# Patient Record
Sex: Male | Born: 1985 | Race: White | Hispanic: No | Marital: Married | Smoking: Never smoker
Health system: Southern US, Community
[De-identification: ages and names within clinical notes are randomized; demographics above are authoritative.]

## PROBLEM LIST (undated history)

## (undated) DIAGNOSIS — I739 Peripheral vascular disease, unspecified: Secondary | ICD-10-CM

## (undated) HISTORY — DX: Peripheral vascular disease, unspecified: I73.9

## (undated) HISTORY — PX: WISDOM TOOTH EXTRACTION: SHX21

---
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
## 2010-08-24 ENCOUNTER — Ambulatory Visit: Payer: Self-pay | Admitting: Sports Medicine

## 2011-03-19 ENCOUNTER — Ambulatory Visit: Payer: Self-pay | Admitting: Cardiovascular Disease

## 2014-04-20 ENCOUNTER — Encounter (INDEPENDENT_AMBULATORY_CARE_PROVIDER_SITE_OTHER): Payer: Self-pay

## 2014-04-20 ENCOUNTER — Ambulatory Visit (INDEPENDENT_AMBULATORY_CARE_PROVIDER_SITE_OTHER): Payer: 59 | Admitting: Family Medicine

## 2014-04-20 ENCOUNTER — Encounter: Payer: Self-pay | Admitting: Family Medicine

## 2014-04-20 VITALS — BP 149/97 | HR 54 | Ht 76.0 in | Wt 200.0 lb

## 2014-04-20 DIAGNOSIS — S99919A Unspecified injury of unspecified ankle, initial encounter: Secondary | ICD-10-CM

## 2014-04-20 DIAGNOSIS — S8992XA Unspecified injury of left lower leg, initial encounter: Secondary | ICD-10-CM

## 2014-04-20 DIAGNOSIS — S8990XA Unspecified injury of unspecified lower leg, initial encounter: Secondary | ICD-10-CM

## 2014-04-20 DIAGNOSIS — S99929A Unspecified injury of unspecified foot, initial encounter: Secondary | ICD-10-CM

## 2014-04-20 NOTE — Patient Instructions (Signed)
You have bruised your meniscus, worst case scenario have a small meniscus tear. Start with conservative treatment. Straight leg raises, knee extensions 3 sets of 10 once a day. Ibuprofen 600mg  three times a day OR aleve 2 tabs twice a day with food for pain and inflammation - typically use for a week then as needed. Icing after exercise for 15 minutes at a time. Elevate as needed for swelling. Ok for straight line running today;  Can try sprints tomorrow, tennis drills Thursday, then full tennis Friday if you continue to do well as I expect you will. Follow up with me in 4-6 weeks. If not improving would consider imaging.

## 2014-04-22 ENCOUNTER — Encounter: Payer: Self-pay | Admitting: Family Medicine

## 2014-04-22 DIAGNOSIS — S8992XA Unspecified injury of left lower leg, initial encounter: Secondary | ICD-10-CM | POA: Insufficient documentation

## 2014-04-22 NOTE — Progress Notes (Signed)
Patient ID: Shawn Hall, male   DOB: [birth year], 28 y.o.   MRN: [id number]  PCP: Enid BaasKarl Fields, MD  Subjective:   HPI: Patient is a 28 y.o. male here for left knee injury.  Patient reports he was dancing, trying to go lower than his body would allow. When doing so he felt and heard a pop medial aspect of left knee. No pain initially - worsened over next 24 hours. Associated swelling though this has improved. No catching, locking, giving out. No prior injuries.  History reviewed. No pertinent past medical history.  No current outpatient prescriptions on file prior to visit.   No current facility-administered medications on file prior to visit.    Past Surgical History  Procedure Laterality Date  . Wisdom tooth extraction      Allergies  Allergen Reactions  . Penicillins     History   Social History  . Marital Status: Single    Spouse Name: N/A    Number of Children: N/A  . Years of Education: N/A   Occupational History  . Not on file.   Social History Main Topics  . Smoking status: Never Smoker   . Smokeless tobacco: Not on file  . Alcohol Use: Not on file  . Drug Use: Not on file  . Sexual Activity: Not on file   Other Topics Concern  . Not on file   Social History Narrative  . No narrative on file    No family history on file.  BP 149/97  Pulse 54  Ht 6\' 4"  (1.93 m)  Wt 200 lb (90.719 kg)  BMI 24.35 kg/m2  Review of Systems: See HPI above.    Objective:  Physical Exam:  Gen: NAD  Left knee: No gross deformity, ecchymoses, swelling. No TTP currently. FROM. Negative ant/post drawers. Negative valgus/varus testing. Negative lachmanns. Negative mcmurrays, apleys, patellar apprehension. NV intact distally.    Assessment & Plan:  1. Left knee injury - Patient's description of pain with a pop are concerning for meniscal injury but he does not have an effusion and meniscus, other tests of knee are negative and reassuring.  Will start with  conservative treatment - home exercise program, nsaids, icing, elevating.  Discussed a return to sports, tennis program as tolerated.  F/u in 4-6 weeks.  Consider imaging if not improving.

## 2014-04-22 NOTE — Assessment & Plan Note (Signed)
Patient's description of pain with a pop are concerning for meniscal injury but he does not have an effusion and meniscus, other tests of knee are negative and reassuring.  Will start with conservative treatment - home exercise program, nsaids, icing, elevating.  Discussed a return to sports, tennis program as tolerated.  F/u in 4-6 weeks.  Consider imaging if not improving.

## 2014-12-01 ENCOUNTER — Ambulatory Visit (INDEPENDENT_AMBULATORY_CARE_PROVIDER_SITE_OTHER): Payer: 59 | Admitting: Sports Medicine

## 2014-12-01 ENCOUNTER — Encounter: Payer: Self-pay | Admitting: Sports Medicine

## 2014-12-01 VITALS — BP 143/98 | HR 66 | Ht 76.0 in | Wt 210.0 lb

## 2014-12-01 DIAGNOSIS — S86112A Strain of other muscle(s) and tendon(s) of posterior muscle group at lower leg level, left leg, initial encounter: Secondary | ICD-10-CM | POA: Diagnosis not present

## 2014-12-01 DIAGNOSIS — S8992XA Unspecified injury of left lower leg, initial encounter: Secondary | ICD-10-CM | POA: Diagnosis not present

## 2014-12-01 DIAGNOSIS — M79672 Pain in left foot: Secondary | ICD-10-CM | POA: Diagnosis not present

## 2014-12-01 NOTE — Assessment & Plan Note (Signed)
-  I will see him back for grafting or possible orthotics, as he needs additional arch support and padding.

## 2014-12-01 NOTE — Assessment & Plan Note (Signed)
Chronic change with hypoechoic collection in the muscle fibers with localized hyperechoic region consistent with scar tissue. -Body helix compression sleeve was fitted and applied to the left calf -He will begin he eccentric calf exercises with progression to eventual concentric exercises when his pain free. -Followup in 4-6 weeks for the left calf, but sooner for custom orthotics

## 2014-12-01 NOTE — Progress Notes (Signed)
   Subjective:    Patient ID: Shawn Hall, male    DOB: June 01, 1986, 29 y.o.   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  Onset was sometime last fall without any known acute injury.  He had been trying to get back into playing more tennis when his symptoms started. He notes a sharp throbbing pain located in the lower portion of his left calf that is aggravated with running, cutting, and pressing on this region.  He denies any swelling or bruising.  He also has left midfoot pain across the dorsal arch.  He denies any numbness or tingling.  He says that sometimes his calf will lock up and feel tight.  He has not tried any relieving factors other than rest.  Past medical history, social history, medications, and allergies were reviewed and are up to date in the chart. Review of Systems 7 point review of systems was performed and was otherwise negative unless noted in the history of present illness.     Objective:   Physical Exam BP 143/98 mmHg  Pulse 66  Ht 6\' 4"  (1.93 m)  Wt 210 lb (95.255 kg)  BMI 25.57 kg/m2 GEN: The patient is well-developed well-nourished male and in no acute distress.  He is awake alert and oriented x3. SKIN: warm and well-perfused, no rash  EXTR: No lower extremity edema Neuro: Strength 5/5 globally. Sensation intact throughout. No focal deficits. Vasc: +2 bilateral distal pulses. No edema.  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  It is a fairly well-preserved medial longitudinal arch.  Good posterior tibialis tendon strength.  He has good plantar and dorsiflexion strength.  No calf atrophy or swelling.  Localized area of tenderness to palpation at the inferior pole of the medial gastroc.  The Achilles tendon is intact and nontender.  Limited musculoskeletal ultrasound: Long and short axis is rotation of the left  calf which reveal a localized area of hypoechoic change in the medial gastroc with associated hyperechoic collection consistent with scar tissue.  No hematoma seen.  The Achilles tendon otherwise appears to have normal caliber and is intact.     Assessment & Plan:  Please see problem based assessment and plan in the problem list.

## 2014-12-07 ENCOUNTER — Encounter: Payer: 59 | Admitting: Sports Medicine

## 2014-12-14 ENCOUNTER — Encounter: Payer: 59 | Admitting: Sports Medicine

## 2014-12-21 ENCOUNTER — Ambulatory Visit (INDEPENDENT_AMBULATORY_CARE_PROVIDER_SITE_OTHER): Payer: 59 | Admitting: Sports Medicine

## 2014-12-21 VITALS — BP 126/80 | HR 69 | Ht 76.0 in | Wt 210.0 lb

## 2014-12-21 DIAGNOSIS — M79672 Pain in left foot: Secondary | ICD-10-CM

## 2014-12-21 NOTE — Assessment & Plan Note (Addendum)
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 Crafted custom orthotics today. Added 1st ray posting bilaterally. -follow-up if needed.  Greater than 50% of this 40 minute visit was spent face-to-face with the patient spent crafting, modifying, and testing the custom orthotics.

## 2014-12-21 NOTE — Progress Notes (Addendum)
   Subjective:    Patient ID: Shawn Hall, male    DOB: 11/02/1985, 29 y.o.   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 Onset of symptoms was sometime last fall without any known acute injury. He had been trying to get back into playing more tennis when his symptoms started. When he was last seen he was diagnosed with a partial tear of the medial head of his left gastroc. He has tried compression and some rehabilitation exercises and his pain is improving. He also has left midfoot pain across the dorsal arch. He has a pes cavus foot and is first ray dominant. He denies any numbness or tingling.  Past medical history, social history, medications, and allergies were reviewed and are up to date in the chart.  Review of Systems 7 point review of systems was performed and was otherwise negative unless noted in the history of present illness.     Objective:   Physical Exam There were no vitals taken for this visit. GEN: The patient is well-developed well-nourished male and in no acute distress.  He is awake alert and oriented x3. SKIN: warm and well-perfused, no rash  EXTR: No lower extremity edema or calf tenderness Neuro: Strength 5/5 globally. Sensation intact throughout. DTRs 2/4 bilaterally. No focal deficits. Vasc: +2 bilateral distal pulses. No edema.  MSK: Examination of the bilateral feet reveals a pes cavus. Full ankle range of motion without pain. Mild pain along the midportion of the medial longitudinal arch. No bony tenderness. He has first ray dominant some slight splaying between the first and second metatarsals. No localized bruising or swelling.  Patient was fitted for a : standard, cushioned, semi-rigid orthotic.  The orthotic was heated and afterward the patient stood on the orthotic blank positioned on the orthotic stand.  The patient was  positioned in subtalar neutral position and 10 degrees of ankle dorsiflexion in a weight bearing stance.  After completion of molding, a stable base was applied to the orthotic blank.  The blank was ground to a stable position for weight bearing.  Size: 13 Base: EVA Posting: bilateral 1st ray  Greater than 50% of this 40 minute visit was spent face-to-face with the patient spent crafting, modifying, and testing the custom orthotics.     Assessment & Plan:  Please see problem based assessment and plan in the problem list.

## 2015-01-12 ENCOUNTER — Ambulatory Visit: Payer: 59 | Admitting: Sports Medicine

## 2015-08-15 ENCOUNTER — Ambulatory Visit (INDEPENDENT_AMBULATORY_CARE_PROVIDER_SITE_OTHER): Payer: 59 | Admitting: Podiatry

## 2015-08-15 ENCOUNTER — Ambulatory Visit (INDEPENDENT_AMBULATORY_CARE_PROVIDER_SITE_OTHER): Payer: 59

## 2015-08-15 ENCOUNTER — Encounter: Payer: Self-pay | Admitting: Podiatry

## 2015-08-15 VITALS — BP 135/87 | HR 74 | Resp 16

## 2015-08-15 DIAGNOSIS — M779 Enthesopathy, unspecified: Secondary | ICD-10-CM

## 2015-08-15 DIAGNOSIS — M79672 Pain in left foot: Secondary | ICD-10-CM

## 2015-08-15 DIAGNOSIS — B372 Candidiasis of skin and nail: Secondary | ICD-10-CM

## 2015-08-15 MED ORDER — TERBINAFINE HCL 250 MG PO TABS: ORAL_TABLET | ORAL | Status: DC

## 2015-08-15 MED ORDER — TRIAMCINOLONE ACETONIDE 10 MG/ML IJ SUSP
10.0000 mg | Freq: Once | INTRAMUSCULAR | Status: AC
  Administered 2015-08-15: 10 mg

## 2015-08-15 NOTE — Progress Notes (Signed)
   Subjective:    Patient ID: Shawn Hall, male    DOB: 07/01/1986, 29 y.o.   MRN: [id number]  HPI Pt presents with pain and numbness in his left foot lasting for over a year now. He states that the pain worsens after walking, he has tried different shoes but pain is not going away. Pain and numbness is located on the lateral and dorsal aspect of the left foot   Review of Systems  All other systems reviewed and are negative.      Objective:   Physical Exam        Assessment & Plan:

## 2015-08-16 NOTE — Progress Notes (Signed)
Subjective:     Patient ID: Shawn Hall, male   DOB: Oct 24, 1985, 29 y.o.   MRN: [id number]  HPI patient presents with one-year history of pain in the outside of the left foot with no specific injury that occurred. He played a lot of tennis and that may cause problems but is not plate for the last year and also complains of numbness   Review of Systems  All other systems reviewed and are negative.      Objective:   Physical Exam  Constitutional: He is oriented to person, place, and time.  Cardiovascular: Intact distal pulses.   Musculoskeletal: Normal range of motion.  Neurological: He is oriented to person, place, and time.  Skin: Skin is warm.  Nursing note and vitals reviewed.  neurovascular status intact muscle strength adequate range of motion within normal limits with patient noted to have quite a bit of discomfort in the peroneal group of the left lateral foot with inflammation noted and moderate flatfoot deformity noted. Patient did not show any signs and neurological disease and did have normal muscle strength and DTR reflexes     Assessment:     Possible inflammatory tendinitis or possibility for some kind of nerve compression or possible problems with back that may be creating this and numbing-like sensations he experiences    Plan:     H&P conditions reviewed and today I did a careful injection of the peroneal group 3 mg Kenalog 5 mg Xylocaine and advised on heat and ice therapy. Reappoint for us to recheck

## 2015-08-22 ENCOUNTER — Ambulatory Visit: Payer: 59 | Admitting: Podiatry

## 2015-08-26 ENCOUNTER — Ambulatory Visit: Payer: 59 | Admitting: Podiatry

## 2016-10-11 ENCOUNTER — Encounter: Payer: 59 | Admitting: Diagnostic Neuroimaging

## 2016-11-22 ENCOUNTER — Encounter: Payer: 59 | Admitting: Diagnostic Neuroimaging

## 2017-11-13 DIAGNOSIS — M5417 Radiculopathy, lumbosacral region: Secondary | ICD-10-CM | POA: Diagnosis not present

## 2017-11-13 DIAGNOSIS — M5386 Other specified dorsopathies, lumbar region: Secondary | ICD-10-CM | POA: Diagnosis not present

## 2017-11-13 DIAGNOSIS — M9903 Segmental and somatic dysfunction of lumbar region: Secondary | ICD-10-CM | POA: Diagnosis not present

## 2017-11-15 DIAGNOSIS — M9903 Segmental and somatic dysfunction of lumbar region: Secondary | ICD-10-CM | POA: Diagnosis not present

## 2017-11-15 DIAGNOSIS — M5417 Radiculopathy, lumbosacral region: Secondary | ICD-10-CM | POA: Diagnosis not present

## 2017-11-15 DIAGNOSIS — M5386 Other specified dorsopathies, lumbar region: Secondary | ICD-10-CM | POA: Diagnosis not present

## 2017-11-18 DIAGNOSIS — M5386 Other specified dorsopathies, lumbar region: Secondary | ICD-10-CM | POA: Diagnosis not present

## 2017-11-18 DIAGNOSIS — M5417 Radiculopathy, lumbosacral region: Secondary | ICD-10-CM | POA: Diagnosis not present

## 2017-11-18 DIAGNOSIS — M9903 Segmental and somatic dysfunction of lumbar region: Secondary | ICD-10-CM | POA: Diagnosis not present

## 2017-11-22 DIAGNOSIS — M5417 Radiculopathy, lumbosacral region: Secondary | ICD-10-CM | POA: Diagnosis not present

## 2017-11-22 DIAGNOSIS — M9903 Segmental and somatic dysfunction of lumbar region: Secondary | ICD-10-CM | POA: Diagnosis not present

## 2017-11-22 DIAGNOSIS — M5386 Other specified dorsopathies, lumbar region: Secondary | ICD-10-CM | POA: Diagnosis not present

## 2017-11-26 DIAGNOSIS — M5386 Other specified dorsopathies, lumbar region: Secondary | ICD-10-CM | POA: Diagnosis not present

## 2017-11-26 DIAGNOSIS — M5417 Radiculopathy, lumbosacral region: Secondary | ICD-10-CM | POA: Diagnosis not present

## 2017-11-26 DIAGNOSIS — M9903 Segmental and somatic dysfunction of lumbar region: Secondary | ICD-10-CM | POA: Diagnosis not present

## 2017-11-29 DIAGNOSIS — M5417 Radiculopathy, lumbosacral region: Secondary | ICD-10-CM | POA: Diagnosis not present

## 2017-11-29 DIAGNOSIS — M9903 Segmental and somatic dysfunction of lumbar region: Secondary | ICD-10-CM | POA: Diagnosis not present

## 2017-11-29 DIAGNOSIS — M5386 Other specified dorsopathies, lumbar region: Secondary | ICD-10-CM | POA: Diagnosis not present

## 2018-06-12 DIAGNOSIS — Z23 Encounter for immunization: Secondary | ICD-10-CM | POA: Diagnosis not present

## 2019-07-22 ENCOUNTER — Other Ambulatory Visit: Payer: Self-pay

## 2019-07-22 ENCOUNTER — Telehealth: Payer: 59 | Admitting: Nurse Practitioner

## 2019-07-22 DIAGNOSIS — R52 Pain, unspecified: Secondary | ICD-10-CM

## 2019-07-22 DIAGNOSIS — R05 Cough: Secondary | ICD-10-CM

## 2019-07-22 DIAGNOSIS — R059 Cough, unspecified: Secondary | ICD-10-CM

## 2019-07-22 DIAGNOSIS — R0602 Shortness of breath: Secondary | ICD-10-CM

## 2019-07-22 DIAGNOSIS — Z20828 Contact with and (suspected) exposure to other viral communicable diseases: Secondary | ICD-10-CM

## 2019-07-22 DIAGNOSIS — Z20822 Contact with and (suspected) exposure to covid-19: Secondary | ICD-10-CM

## 2019-07-22 MED ORDER — BENZONATATE 100 MG PO CAPS: 100.0000 mg | ORAL_CAPSULE | Freq: Three times a day (TID) | ORAL | 0 refills | Status: DC | PRN

## 2019-07-22 NOTE — Progress Notes (Signed)
E-Visit for Corona Virus Screening   Your current symptoms could be consistent with the coronavirus.  Many health care providers can now test patients at their office but not all are.  [facility] has multiple testing sites. For information on our COVID testing locations and hours go to [email or web address]  Please quarantine yourself while awaiting your test results.  We are enrolling you in our MyChart Home Mont[facility] for COVID19 . Daily you will receive a questionnaire within the MyChart website. Our COVID 19 response team willl be monitoriing your responses daily.  You can go to one of the  testing sites listed below, while they are opened (see hours). You do not need a doctors order to be tested for covid.You do need to self-isolate until your results return and if positive 14 days from when your symptoms started and until you are 3 days symptom free.   Testing Locations (Monday - Friday, 10a.m. - 3:30 p.m.)   [place]:  [facility] (Visitor Entrance), [address], [place] -   [place]: [facility], [address], [place], [place] (entrance off [address]) -   [place] (Closed each Monday): [address], [place],  - the short stay covered drive at [facility] (Use the [address] entrance to [facility] next to [facility].) -     COVID-19 is a respiratory illness with symptoms that are similar to the flu. Symptoms are typically mild to moderate, but there have been cases of severe illness and death due to the virus. The following symptoms may appear 2-14 days after exposure: . Fever . Cough . Shortness of breath or difficulty breathing . Chills . Repeated shaking with chills . Muscle pain . Headache . Sore throat . New loss of taste or smell . Fatigue . Congestion or runny nose . Nausea or vomiting . Diarrhea  It is vitally  important that if you feel that you have an infection such as this virus or any other virus that you stay home and away from places where you may spread it to others.  You should self-quarantine for 14 days if you have symptoms that could potentially be coronavirus or have been in close contact a with a person diagnosed with COVID-19 within the last 2 weeks. You should avoid contact with people age 65 and older.   You should wear a mask or cloth face covering over your nose and mouth if you must be around other people or animals, including pets (even at home). Try to stay at least 6 feet away from other people. This will protect the people around you.  You can use medication such as A prescription cough medication called Tessalon Perles 100 mg. You may take 1-2 capsules every 8 hours as needed for cough  You may also take acetaminophen (Tylenol) as needed for fever.   Reduce your risk of any infection by using the same precautions used for avoiding the common cold or flu:  . Wash your hands often with soap and warm water for at least 20 seconds.  If soap and water are not readily available, use an alcohol-based hand sanitizer with at least 60% alcohol.  . If coughing or sneezing, cover your mouth and nose by coughing or sneezing into the elbow areas of your shirt or coat, into a tissue or into your sleeve (not your hands). . Avoid shaking hands with others and consider head nods or verbal   greetings only. . Avoid touching your eyes, nose, or mouth with unwashed hands.  . Avoid close contact with people who are sick. . Avoid places or events with large numbers of people in one location, like concerts or sporting events. . Carefully consider travel plans you have or are making. . If you are planning any travel outside or inside the [place], visit the CDC's Travelers' Health webpage for the latest health notices. . If you have some symptoms but not all symptoms, continue to monitor at home and seek medical  attention if your symptoms worsen. . If you are having a medical emergency, call 911.  HOME CARE . Only take medications as instructed by your medical team. . Drink plenty of fluids and get plenty of rest. . A steam or ultrasonic humidifier can help if you have congestion.   GET HELP RIGHT AWAY IF YOU HAVE EMERGENCY WARNING SIGNS** FOR COVID-19. If you or someone is showing any of these signs seek emergency medical care immediately. Call 911 or proceed to your closest emergency facility if: . You develop worsening high fever. . Trouble breathing . Bluish lips or face . Persistent pain or pressure in the chest . New confusion . Inability to wake or stay awake . You cough up blood. . Your symptoms become more severe  **This list is not all possible symptoms. Contact your medical provider for any symptoms that are sever or concerning to you.   MAKE SURE YOU   Understand these instructions.  Will watch your condition.  Will get help right away if you are not doing well or get worse.  Your e-visit answers were reviewed by a board certified advanced clinical practitioner to complete your personal care plan.  Depending on the condition, your plan could have included both over the counter or prescription medications.  If there is a problem please reply once you have received a response from your provider.  Your safety is important to us.  If you have drug allergies check your prescription carefully.    You can use MyChart to ask questions about today's visit, request a non-urgent call back, or ask for a work or school excuse for 24 hours related to this e-Visit. If it has been greater than 24 hours you will need to follow up with your provider, or enter a new e-Visit to address those concerns. You will get an e-mail in the next two days asking about your experience.  I hope that your e-visit has been valuable and will speed your recovery. Thank you for using e-visits.   5-10 minutes  spent reviewing and documenting in chart.  

## 2019-07-24 LAB — NOVEL CORONAVIRUS, NAA: SARS-CoV-2, NAA: NOT DETECTED

## 2019-09-10 ENCOUNTER — Other Ambulatory Visit: Payer: Self-pay

## 2019-09-10 ENCOUNTER — Encounter: Payer: Self-pay | Admitting: Podiatry

## 2019-09-10 ENCOUNTER — Ambulatory Visit (INDEPENDENT_AMBULATORY_CARE_PROVIDER_SITE_OTHER): Payer: 59 | Admitting: Podiatry

## 2019-09-10 DIAGNOSIS — B351 Tinea unguium: Secondary | ICD-10-CM | POA: Diagnosis not present

## 2019-09-10 MED ORDER — TERBINAFINE HCL 250 MG PO TABS: 250.0000 mg | ORAL_TABLET | Freq: Every day | ORAL | 0 refills | Status: DC

## 2019-09-10 NOTE — Progress Notes (Signed)
Subjective:   Patient ID: Shawn Hall, male   DOB: 33 y.o.   MRN: [id number]   HPI Patient presents stating he has had trouble with his skin for about 15 years left heel and foot and gets cracks and it becomes painful and also his nails are thickened on his left foot with no issue with his right foot.  Patient has good digital perfusion and states that he does not smoke likes to be active   Review of Systems  All other systems reviewed and are negative.       Objective:  Physical Exam Vitals and nursing note reviewed.  Constitutional:      Appearance: He is well-developed.  Pulmonary:     Effort: Pulmonary effort is normal.  Musculoskeletal:        General: Normal range of motion.  Skin:    General: Skin is warm.  Neurological:     Mental Status: He is alert.     Neurovascular status intact muscle strength was found to be adequate range of motion within normal limits.  Patient is found to have thick dystrophic skin formation with moccasin appearance to the left foot and nail disease with discoloration of several nails on the left foot.  Patient has good digital perfusion bilateral and no issues with his right foot     Assessment:  Possibility for fungal infection left with eczema-like condition or simple dry skin condition left     Plan:  P reviewed condition and nail disease.  We are going to try to take an aggressive approach to get this better and I am going to place him on 90 days of antifungal therapy along with cream and Vaseline twice a week.  Patient will be seen back for [place] to recheck again in 6 months and we will see the response and try to develop a long-term plan for him and I did spend a great deal of time with him reviewing this

## 2020-01-06 ENCOUNTER — Other Ambulatory Visit: Payer: 59

## 2020-01-06 ENCOUNTER — Ambulatory Visit: Payer: 59 | Attending: Internal Medicine

## 2020-01-06 DIAGNOSIS — Z20822 Contact with and (suspected) exposure to covid-19: Secondary | ICD-10-CM

## 2020-01-07 LAB — NOVEL CORONAVIRUS, NAA: SARS-CoV-2, NAA: NOT DETECTED

## 2020-01-07 LAB — SARS-COV-2, NAA 2 DAY TAT

## 2020-03-07 ENCOUNTER — Ambulatory Visit: Payer: 59 | Admitting: Podiatry

## 2021-12-27 ENCOUNTER — Telehealth: Payer: Self-pay | Admitting: Physical Medicine and Rehabilitation

## 2021-12-27 NOTE — Telephone Encounter (Signed)
Ok for left L5-S1 interlam epidural, referral from Dr. Berline Chough in media tab, please enter as order/referral. Also when scheduling please have him bring MRI report and or CD with him. Dr. Berline Chough said he had MRI in Nov 2022 but not in Epic when I look. ?

## 2022-01-01 ENCOUNTER — Other Ambulatory Visit (HOSPITAL_COMMUNITY): Payer: Self-pay | Admitting: Sports Medicine

## 2022-01-01 DIAGNOSIS — R52 Pain, unspecified: Secondary | ICD-10-CM

## 2022-01-04 ENCOUNTER — Ambulatory Visit (HOSPITAL_COMMUNITY)
Admission: RE | Admit: 2022-01-04 | Discharge: 2022-01-04 | Disposition: A | Discharge status: Home / Self Care | Payer: 59 | Source: Ambulatory Visit | Attending: Sports Medicine | Admitting: Sports Medicine

## 2022-01-04 DIAGNOSIS — R52 Pain, unspecified: Secondary | ICD-10-CM | POA: Diagnosis present

## 2022-02-07 ENCOUNTER — Telehealth: Payer: Self-pay | Admitting: Physical Medicine and Rehabilitation

## 2022-02-07 NOTE — Telephone Encounter (Signed)
Patient called he would like to CXL appointment with Dr. Alvester Morin. Don't need

## 2022-02-08 NOTE — Telephone Encounter (Signed)
done

## 2022-02-12 ENCOUNTER — Encounter: Payer: 59 | Admitting: Physical Medicine and Rehabilitation

## 2022-02-12 ENCOUNTER — Other Ambulatory Visit (HOSPITAL_COMMUNITY): Payer: Self-pay | Admitting: Surgery

## 2022-02-12 ENCOUNTER — Ambulatory Visit (HOSPITAL_COMMUNITY)
Admission: RE | Admit: 2022-02-12 | Discharge: 2022-02-12 | Disposition: A | Discharge status: Home / Self Care | Payer: 59 | Source: Ambulatory Visit | Attending: Surgery | Admitting: Surgery

## 2022-02-12 ENCOUNTER — Encounter: Payer: Self-pay | Admitting: Surgery

## 2022-02-12 ENCOUNTER — Ambulatory Visit (INDEPENDENT_AMBULATORY_CARE_PROVIDER_SITE_OTHER): Payer: 59 | Admitting: Surgery

## 2022-02-12 VITALS — BP 138/90 | HR 53 | Temp 98.3°F | Resp 20 | Ht 76.0 in | Wt 217.0 lb

## 2022-02-12 DIAGNOSIS — I739 Peripheral vascular disease, unspecified: Secondary | ICD-10-CM

## 2022-02-12 DIAGNOSIS — M79605 Pain in left leg: Secondary | ICD-10-CM | POA: Diagnosis not present

## 2022-02-12 NOTE — Progress Notes (Signed)
[facility]  Patient name: Shawn MassedDavid B Lebeda MRN: [id number] DOB: 1986/06/14 Sex: male   REQUESTING PROVIDER:    Dr. Berline Choughigby   REASON FOR CONSULT:    PAD  HISTORY OF PRESENT ILLNESS:   Shawn Hall is a 36 y.o. male, who is referred for evaluation of left leg pain.  He states that this has been getting progressively worse over the past several years..  With minimal activity, he gets pain and numbness going down the leg which necessitates a resting all activity until it improves.  He has undergone orthopedic and neurologic work-up.  I do not have the results of his MRIs but reportedly they were negative.  He denies any swelling.  PAST MEDICAL HISTORY    History reviewed. No pertinent past medical history.   FAMILY HISTORY   History reviewed. No pertinent family history.  SOCIAL HISTORY:   Social History   Socioeconomic History   Marital status: Married    Spouse name: Not on file   Number of children: Not on file   Years of education: Not on file   Highest education level: Not on file  Occupational History   Not on file  Tobacco Use   Smoking status: Never   Smokeless tobacco: Not on file  Vaping Use   Vaping Use: Never used  Substance and Sexual Activity   Alcohol use: Not on file   Drug use: Not on file   Sexual activity: Not on file  Other Topics Concern   Not on file  Social History Narrative   Not on file   Social Determinants of Health   Financial Resource Strain: Not on file  Food Insecurity: Not on file  Transportation Needs: Not on file  Physical Activity: Not on file  Stress: Not on file  Social Connections: Not on file  Intimate Partner Violence: Not on file    ALLERGIES:    Allergies  Allergen Reactions   Penicillins     CURRENT MEDICATIONS:    No current outpatient medications on file.   No current facility-administered medications for this visit.    REVIEW OF  SYSTEMS:   [X]  denotes positive finding, [ ]  denotes negative finding Cardiac  Comments:  Chest pain or chest pressure:    Shortness of breath upon exertion:    Short of breath when lying flat:    Irregular heart rhythm:        Vascular    Pain in calf, thigh, or hip brought on by ambulation: x   Pain in feet at night that wakes you up from your sleep:     Blood clot in your veins:    Leg swelling:         Pulmonary    Oxygen at home:    Productive cough:     Wheezing:         Neurologic    Sudden weakness in arms or legs:     Sudden numbness in arms or legs:     Sudden onset of difficulty speaking or slurred speech:    Temporary loss of vision in one eye:     Problems with dizziness:         Gastrointestinal    Blood in stool:      Vomited blood:         Genitourinary    Burning when urinating:     Blood in urine:        Psychiatric  Major depression:         Hematologic    Bleeding problems:    Problems with blood clotting too easily:        Skin    Rashes or ulcers:        Constitutional    Fever or chills:     PHYSICAL EXAM:   Vitals:   02/12/22 0905  BP: 138/90  Pulse: (!) 53  Resp: 20  Temp: 98.3 F (36.8 C)  SpO2: 97%  Weight: 217 lb (98.4 kg)  Height: 6\' 4"  (1.93 m)    GENERAL: The patient is a well-nourished male, in no acute distress. The vital signs are documented above. CARDIAC: There is a regular rate and rhythm.  VASCULAR: Triphasic right pedal signals, biphasic dorsalis pedis on the left and monophasic PT on the left PULMONARY: Nonlabored respirations MUSCULOSKELETAL: There are no major deformities or cyanosis. NEUROLOGIC: No focal weakness or paresthesias are detected. SKIN: There are no ulcers or rashes noted. PSYCHIATRIC: The patient has a normal affect.  STUDIES:   I have reviewed the following studies: +---------+------------------+-----+---------+--------+  Right    Rt Pressure (mmHg)IndexWaveform Comment    +---------+------------------+-----+---------+--------+  Brachial 131                    triphasic          +---------+------------------+-----+---------+--------+  PTA      151               1.11 triphasic          +---------+------------------+-----+---------+--------+  DP       143               1.05 triphasic          +---------+------------------+-----+---------+--------+  Great Toe151               1.11 Normal             +---------+------------------+-----+---------+--------+   +---------+------------------+-----+----------------------------------+----  ---+  Left     Lt Pressure (mmHg)IndexWaveform                            Comment  +---------+------------------+-----+----------------------------------+----  ---+  Brachial 136                    triphasic                                    +---------+------------------+-----+----------------------------------+----  ---+  PTA      73                0.54 monophasic                                   +---------+------------------+-----+----------------------------------+----  ---+  DP       110               0.81 biphasic                                     +---------+------------------+-----+----------------------------------+----  ---+  Great Toe98                0.72 Normal pressure, abnormal waveform  Summary:  Right: Resting right ankle-brachial index is within normal range. No  evidence of significant right lower extremity arterial disease. The right  toe-brachial index is normal.   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 The left toe-brachial index is within normal  range; however, waveforms are abnormal.   Exercise testing revealed significant drop and left ABI down to 0.65   ASSESSMENT and PLAN   Left leg pain: There is clearly an arterial component to his symptoms as there was a significant  drop in his ABIs with exercise.  Because of his age, I will need to consider nonatherosclerotic vascular disease including popliteal entrapment, adventitial cystic disease, or possibly exercise-induced compartment syndrome.  I am going to start with an MRI/MRI of the left leg.  I have scheduled him for follow-up in 1 week.  He potentially could require CT imaging as well.   Charlena Cross, MD, FACS [facility] Vein Specialists of [facility] [phone] Pager [phone]

## 2022-02-13 ENCOUNTER — Other Ambulatory Visit: Payer: Self-pay

## 2022-02-13 DIAGNOSIS — M79605 Pain in left leg: Secondary | ICD-10-CM

## 2022-02-16 ENCOUNTER — Ambulatory Visit (HOSPITAL_COMMUNITY)
Admission: RE | Admit: 2022-02-16 | Discharge: 2022-02-16 | Disposition: A | Discharge status: Home / Self Care | Payer: 59 | Source: Ambulatory Visit | Attending: Surgery | Admitting: Surgery

## 2022-02-16 DIAGNOSIS — M79605 Pain in left leg: Secondary | ICD-10-CM

## 2022-02-16 IMAGING — MR MR MRA EXTREM LOWER WO/W CM*L*
6 of 13 series · 12 of 40 positions shown · IV contrast (gadolinium)
Comparison: None Available.

CLINICAL DATA: Left lateral knee to ankle pain for the past 5-6
years. Evaluate for stress fracture versus popliteal entrapment

EXAM:
MR MRA OF THE LEFT LOWER EXTREMITY WITHOUT AND WITH CONTRAST; MRI OF
LOWER LEFT EXTREMITY WITHOUT AND WITH CONTRAST
TECHNIQUE: MRI and MRA imaging of the bilateral lower extremities performed
with and without gadolinium contrast.
10 mL Gadavist administered

[Series 4: T1 · coronal · 4.0mm · 1.95mm/px · 1 of 30 slices shown]
[im 1/30]
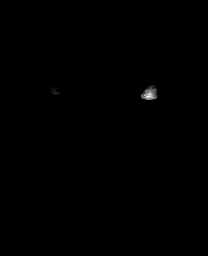

[Series 12: GRE · axial · 6.0mm · 1.48mm/px · 1 of 48 slices shown]
[im 1/48]
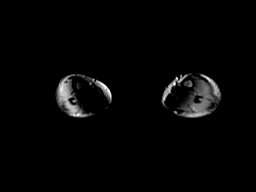

[Series 13: ax true-fisp · axial · 6.0mm · 0.74mm/px · 1 of 48 slices shown]
[im 1/48]
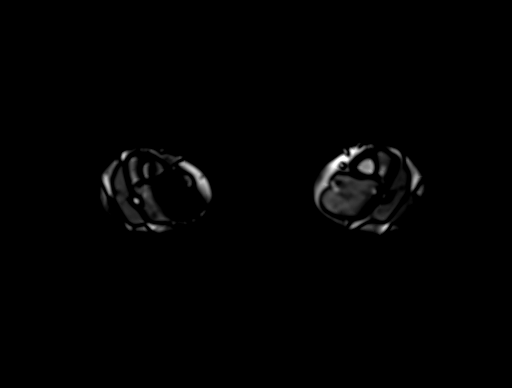

[Series 14: TOF · axial · 6.0mm · 1.12mm/px · 1 of 60 slices shown]
[im 1/60]
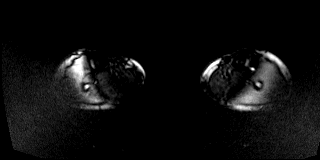

[Series 15: T1 dynamic · axial · non-contrast · 3.0mm · 1.19mm/px · z∈[-179,+153]mm · 4 of 112 slices shown]
[im 1/112]
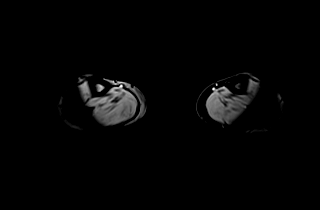
[im 38/112]
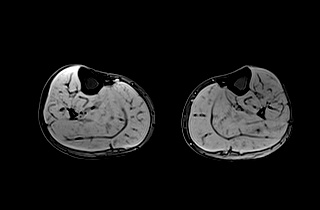
[im 75/112]
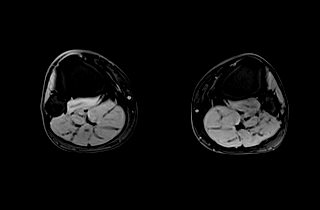
[im 112/112]
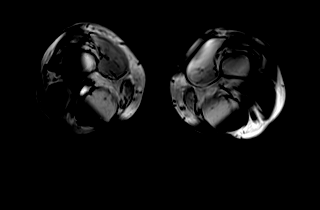

[Series 29: T1 dynamic post-contrast · axial · 3.0mm · 1.19mm/px · z∈[-179,+153]mm · 4 of 112 slices shown]
[im 1/112]
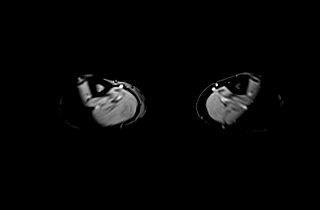
[im 38/112]
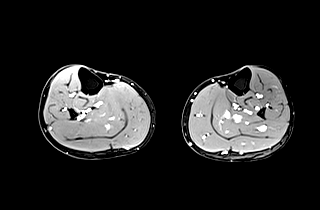
[im 75/112]
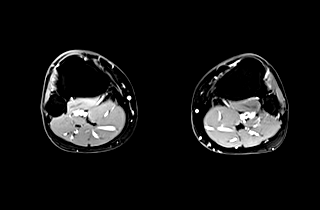
[im 112/112]
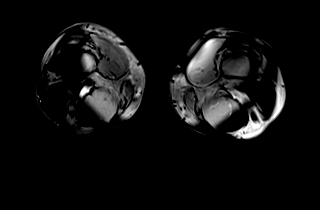

[12 of 40 positions shown; findings below may reference images not displayed]

FINDINGS: Vascular: Normal appearance of the visualized portion of the right
superficial femoral artery, the right popliteal artery, anterior
tibial artery, tibioperoneal trunk, posterior tibial and peroneal
arteries. However, there is an abnormal appearance on the left.
Focal narrowing of the distal superficial femoral artery with
complete occlusion of the popliteal artery. The tibioperoneal trunk
is also occluded. The anterior tibial artery reconstitutes via
collateral flow. The posterior tibial and peroneal arteries also
reconstitute via collateral flow in the mid calf.

Nonvascular: No focal signal abnormality or abnormal enhancement
within the muscular or osseous structures. Normal appearance of the
gastrocnemius muscles. No abnormality of the muscular origins to
suggest popliteal entrapment.
IMPRESSION: 1. Positive for significant vascular abnormality on the left. There
is irregularity and narrowing of the distal superficial femoral
artery and chronic complete occlusion of the popliteal artery and
tibioperoneal trunk. The runoff arteries reconstitute via collateral
flow. This may represent sequelae of prior trauma or embolization.
2. Normal appearance of the right lower extremity arterial
structures.
3. No evidence of popliteal artery entrapment or acute osseous
abnormality.

## 2022-02-16 IMAGING — MR MR [PERSON_NAME] LOW WO/W CM*L*
11 series · 40 of 40 positions shown · IV contrast (gadolinium)
Comparison: None Available.

CLINICAL DATA: Left lateral knee to ankle pain for the past 5-6
years. Evaluate for stress fracture versus popliteal entrapment

EXAM:
MR MRA OF THE LEFT LOWER EXTREMITY WITHOUT AND WITH CONTRAST; MRI OF
LOWER LEFT EXTREMITY WITHOUT AND WITH CONTRAST
TECHNIQUE: MRI and MRA imaging of the bilateral lower extremities performed
with and without gadolinium contrast.
10 mL Gadavist administered

[Series 5: T1 · coronal · 4.0mm · 1.95mm/px · 2 of 30 slices shown (1 of 2)]
[im 1/30]
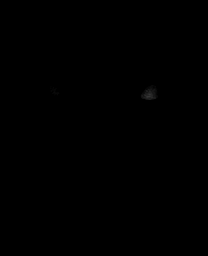
[im 30/30]
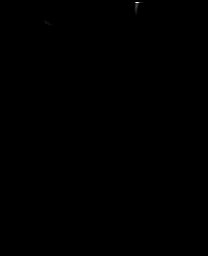

[Series 6: STIR · coronal · 4.0mm · 1.95mm/px · 3 of 29 slices shown (1 of 3)]
[im 1/29]
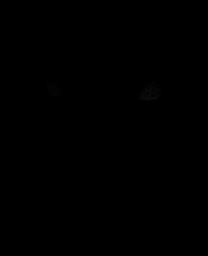
[im 15/29]
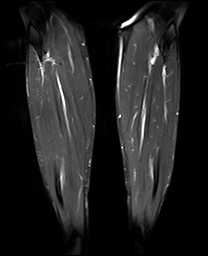
[im 29/29]
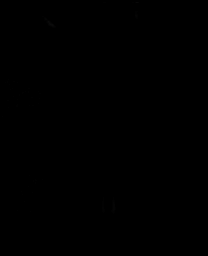

[Series 7: T1 · axial · 5.0mm · 1.41mm/px · z∈[-244,+105]mm · 5 of 57 slices shown (2 of 2)]
[im 1/57]
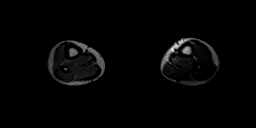
[im 15/57]
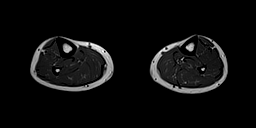
[im 29/57]
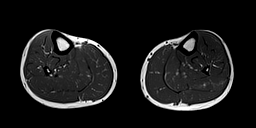
[im 43/57]
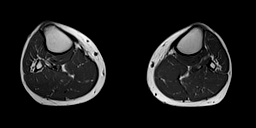
[im 57/57]
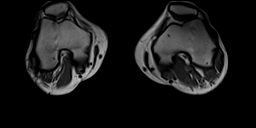

[Series 8: T2 fat-sat · axial · 5.0mm · 1.41mm/px · z∈[-244,+105]mm · 5 of 57 slices shown]
[im 1/57]
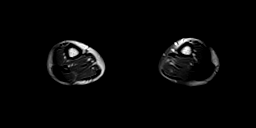
[im 15/57]
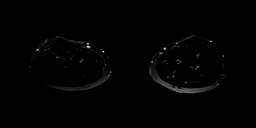
[im 29/57]
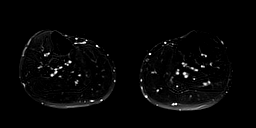
[im 43/57]
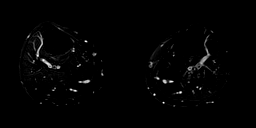
[im 57/57]
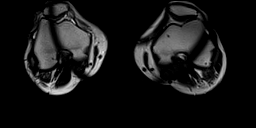

[Series 9: STIR · sagittal · 4.0mm · 1.95mm/px · 3 of 31 slices shown (2 of 3)]
[im 1/31]
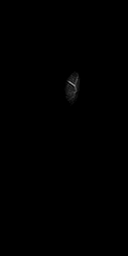
[im 16/31]
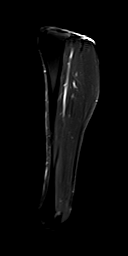
[im 31/31]
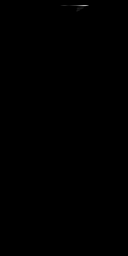

[Series 10: STIR · sagittal · 4.0mm · 1.95mm/px · 3 of 31 slices shown (3 of 3)]
[im 1/31]
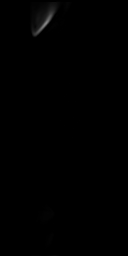
[im 16/31]
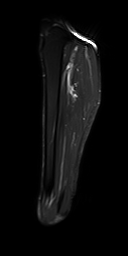
[im 31/31]
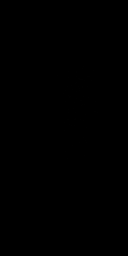

[Series 11: pre axial ti · axial · non-contrast · 5.0mm · 0.70mm/px · z∈[-244,+105]mm · 5 of 57 slices shown]
[im 1/57]
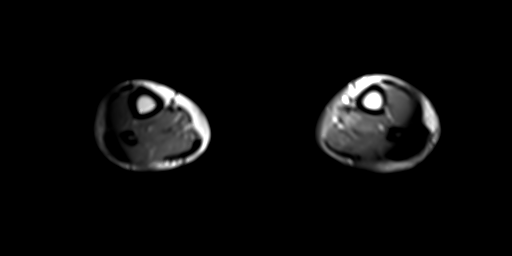
[im 15/57]
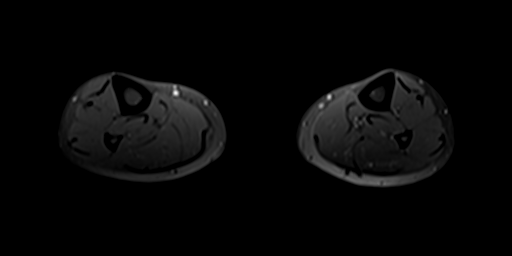
[im 29/57]
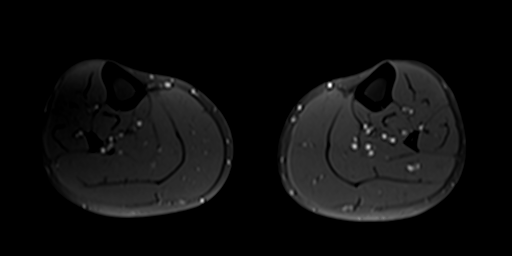
[im 43/57]
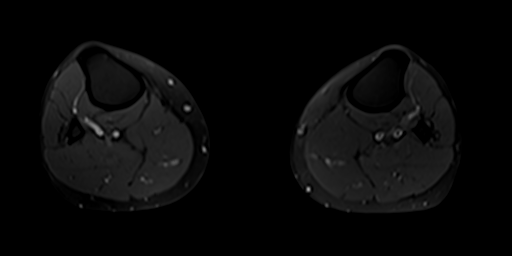
[im 57/57]
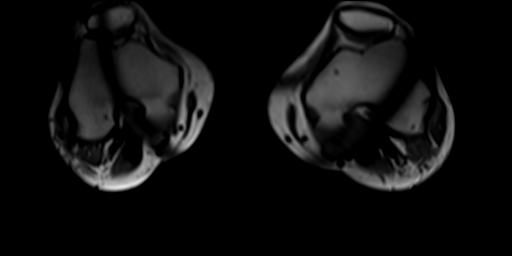

[Series 31: T1 fat-sat post-contrast · coronal · 4.0mm · 1.95mm/px · 3 of 30 slices shown]
[im 1/30]
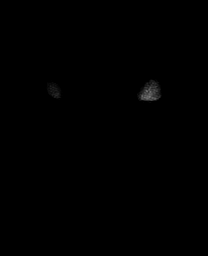
[im 15/30]
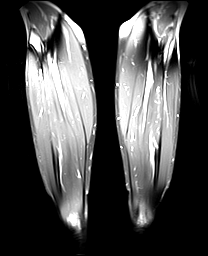
[im 30/30]
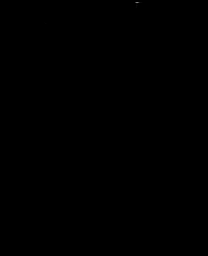

[Series 32: post axial ti · axial · 5.0mm · 0.70mm/px · z∈[-244,+105]mm · 5 of 57 slices shown]
[im 1/57]
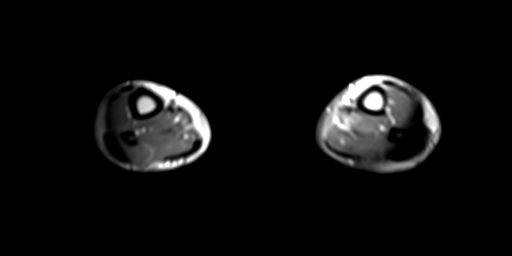
[im 15/57]
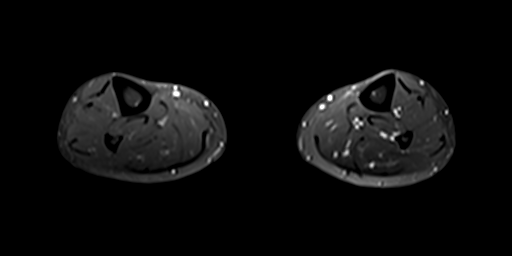
[im 29/57]
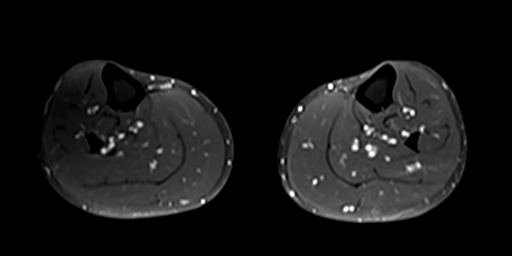
[im 43/57]
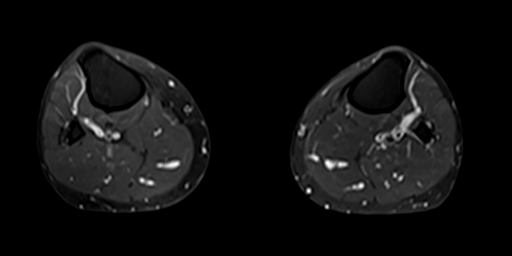
[im 57/57]
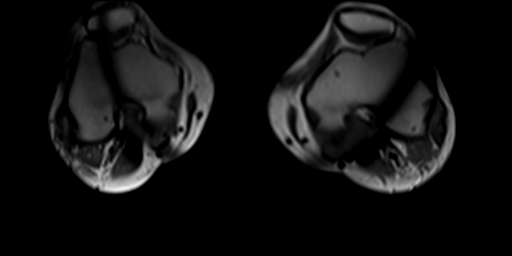

[Series 33: post sag ti · sagittal · 4.0mm · 0.98mm/px · 3 of 31 slices shown (1 of 2)]
[im 1/31]
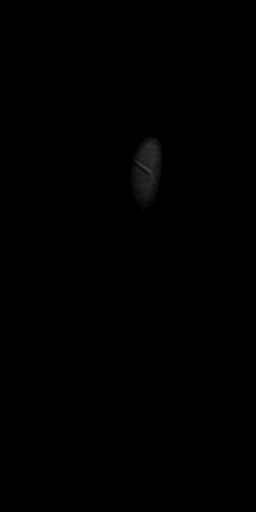
[im 16/31]
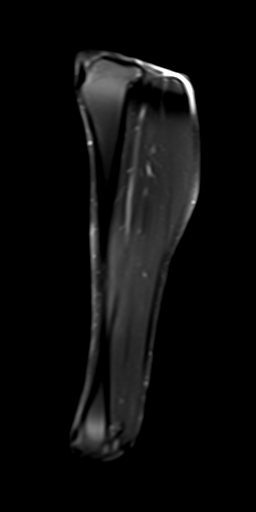
[im 31/31]
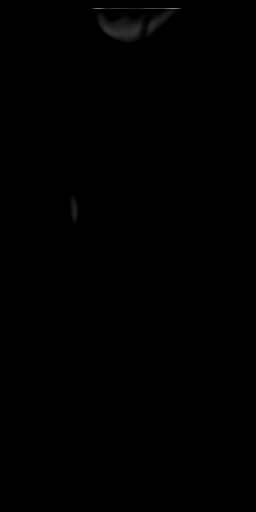

[Series 34: post sag ti · sagittal · 4.0mm · 0.98mm/px · 3 of 31 slices shown (2 of 2)]
[im 1/31]
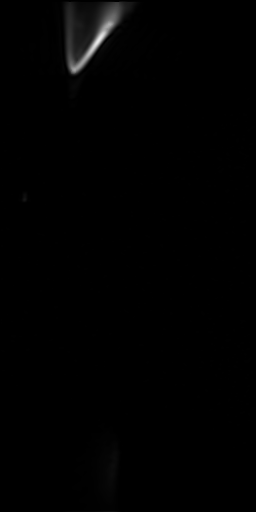
[im 16/31]
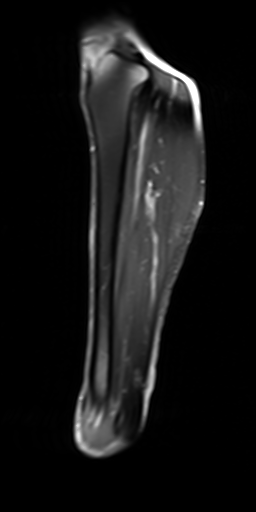
[im 31/31]
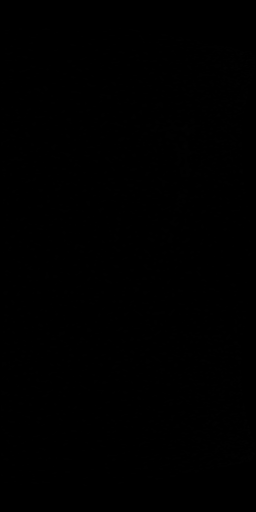

[40 of 40 positions shown; findings below may reference images not displayed]

FINDINGS: Vascular: Normal appearance of the visualized portion of the right
superficial femoral artery, the right popliteal artery, anterior
tibial artery, tibioperoneal trunk, posterior tibial and peroneal
arteries. However, there is an abnormal appearance on the left.
Focal narrowing of the distal superficial femoral artery with
complete occlusion of the popliteal artery. The tibioperoneal trunk
is also occluded. The anterior tibial artery reconstitutes via
collateral flow. The posterior tibial and peroneal arteries also
reconstitute via collateral flow in the mid calf.

Nonvascular: No focal signal abnormality or abnormal enhancement
within the muscular or osseous structures. Normal appearance of the
gastrocnemius muscles. No abnormality of the muscular origins to
suggest popliteal entrapment.
IMPRESSION: 1. Positive for significant vascular abnormality on the left. There
is irregularity and narrowing of the distal superficial femoral
artery and chronic complete occlusion of the popliteal artery and
tibioperoneal trunk. The runoff arteries reconstitute via collateral
flow. This may represent sequelae of prior trauma or embolization.
2. Normal appearance of the right lower extremity arterial
structures.
3. No evidence of popliteal artery entrapment or acute osseous
abnormality.

## 2022-02-16 MED ORDER — GADOBUTROL 1 MMOL/ML IV SOLN
10.0000 mL | Freq: Once | INTRAVENOUS | Status: AC | PRN
  Administered 2022-02-16: 10 mL via INTRAVENOUS

## 2022-02-19 ENCOUNTER — Encounter: Payer: Self-pay | Admitting: Surgery

## 2022-02-19 ENCOUNTER — Ambulatory Visit (INDEPENDENT_AMBULATORY_CARE_PROVIDER_SITE_OTHER): Payer: 59 | Admitting: Surgery

## 2022-02-19 VITALS — BP 139/91 | HR 54 | Temp 97.9°F | Resp 20 | Ht 76.0 in | Wt 217.0 lb

## 2022-02-19 DIAGNOSIS — I70212 Atherosclerosis of native arteries of extremities with intermittent claudication, left leg: Secondary | ICD-10-CM

## 2022-02-19 NOTE — Progress Notes (Signed)
Vascular and Vein Specialist of [facility]  Patient name: Shawn Hall MRN: [id number] DOB: May 09, 1986 Sex: male   REASON FOR VISIT:    Follow up  HISOTRY OF PRESENT ILLNESS:    Shawn Hall is a 36 y.o. male who I saw last week for evaluation of left leg pain that has been getting worse over the past several years.  He gets pain and numbness going down his leg with minimal activity.  Resting alleviates his symptoms.  He has undergone an orthopedic and neurologic work-up that has been unremarkable.  I performed exercise testing last week and he had a drop in his ABIs.  I sent him for a MR a/MRI to evaluate for popliteal entrapment or adventitial cystic disease.  He is back today to discuss these results.   PAST MEDICAL HISTORY:   History reviewed. No pertinent past medical history.   FAMILY HISTORY:   History reviewed. No pertinent family history.  SOCIAL HISTORY:   Social History   Tobacco Use   Smoking status: Never   Smokeless tobacco: Not on file  Substance Use Topics   Alcohol use: Not on file     ALLERGIES:   Allergies  Allergen Reactions   Penicillins      CURRENT MEDICATIONS:   No current outpatient medications on file.   No current facility-administered medications for this visit.    REVIEW OF SYSTEMS:   [X]  denotes positive finding, [ ]  denotes negative finding Cardiac  Comments:  Chest pain or chest pressure:    Shortness of breath upon exertion:    Short of breath when lying flat:    Irregular heart rhythm:        Vascular    Pain in calf, thigh, or hip brought on by ambulation: x   Pain in feet at night that wakes you up from your sleep:     Blood clot in your veins:    Leg swelling:         Pulmonary    Oxygen at home:    Productive cough:     Wheezing:         Neurologic    Sudden weakness in arms or legs:     Sudden numbness in arms or legs:     Sudden onset of difficulty speaking or  slurred speech:    Temporary loss of vision in one eye:     Problems with dizziness:         Gastrointestinal    Blood in stool:     Vomited blood:         Genitourinary    Burning when urinating:     Blood in urine:        Psychiatric    Major depression:         Hematologic    Bleeding problems:    Problems with blood clotting too easily:        Skin    Rashes or ulcers:        Constitutional    Fever or chills:      PHYSICAL EXAM:   Vitals:   02/19/22 1105  BP: (!) 139/91  Pulse: (!) 54  Resp: 20  Temp: 97.9 F (36.6 C)  SpO2: 99%  Weight: 217 lb (98.4 kg)  Height: 6\' 4"  (1.93 m)    GENERAL: The patient is a well-nourished male, in no acute distress. The vital signs are documented above. CARDIAC: There is a regular rate and rhythm.  PULMONARY: Non-labored respirations MUSCULOSKELETAL: There are no major deformities or cyanosis. NEUROLOGIC: No focal weakness or paresthesias are detected. SKIN: There are no ulcers or rashes noted. PSYCHIATRIC: The patient has a normal affect.  STUDIES:   I have reviewed the following MRI: 1. Positive for significant vascular abnormality on the left. 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 The runoff arteries reconstitute via collateral flow. This may represent sequelae of prior trauma or embolization. 2. Normal appearance of the right lower extremity arterial structures. 3. No evidence of popliteal artery entrapment or acute osseous abnormality.   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  There are no significant muscular abnormalities that would be concerning for popliteal entrapment.  He denies any significant history of trauma.  The etiology of his popliteal occlusion is not clear.  Could potentially be embolic.  We discussed proceeding with angiography  on June 27.  This will be through a right femoral approach.  Based off these results, we will plan for surgical revascularization.  He will need vein mapping while he is in the hospital.    Durene Cal, IV, MD, FACS [facility]ascular and Vein Specialists of [facility] [phone] Pager [phone]

## 2022-02-21 ENCOUNTER — Other Ambulatory Visit: Payer: Self-pay

## 2022-02-21 DIAGNOSIS — I70212 Atherosclerosis of native arteries of extremities with intermittent claudication, left leg: Secondary | ICD-10-CM

## 2022-02-21 DIAGNOSIS — I70202 Unspecified atherosclerosis of native arteries of extremities, left leg: Secondary | ICD-10-CM

## 2022-03-06 ENCOUNTER — Other Ambulatory Visit: Payer: Self-pay

## 2022-03-06 ENCOUNTER — Encounter (HOSPITAL_COMMUNITY)
Admission: RE | Disposition: A | Discharge status: Home / Self Care | Payer: Self-pay | Source: Home / Self Care | Attending: Surgery

## 2022-03-06 ENCOUNTER — Ambulatory Visit (HOSPITAL_COMMUNITY): Payer: 59

## 2022-03-06 ENCOUNTER — Ambulatory Visit (HOSPITAL_BASED_OUTPATIENT_CLINIC_OR_DEPARTMENT_OTHER): Payer: 59

## 2022-03-06 ENCOUNTER — Ambulatory Visit (HOSPITAL_COMMUNITY)
Admission: RE | Admit: 2022-03-06 | Discharge: 2022-03-06 | Disposition: A | Discharge status: Home / Self Care | Payer: 59 | Attending: Surgery | Admitting: Surgery

## 2022-03-06 DIAGNOSIS — I70212 Atherosclerosis of native arteries of extremities with intermittent claudication, left leg: Secondary | ICD-10-CM | POA: Insufficient documentation

## 2022-03-06 DIAGNOSIS — I739 Peripheral vascular disease, unspecified: Secondary | ICD-10-CM

## 2022-03-06 DIAGNOSIS — Z01810 Encounter for preprocedural cardiovascular examination: Secondary | ICD-10-CM

## 2022-03-06 DIAGNOSIS — R109 Unspecified abdominal pain: Secondary | ICD-10-CM | POA: Diagnosis not present

## 2022-03-06 DIAGNOSIS — N3289 Other specified disorders of bladder: Secondary | ICD-10-CM | POA: Diagnosis not present

## 2022-03-06 DIAGNOSIS — I70202 Unspecified atherosclerosis of native arteries of extremities, left leg: Secondary | ICD-10-CM

## 2022-03-06 HISTORY — PX: ABDOMINAL AORTOGRAM W/LOWER EXTREMITY: CATH118223

## 2022-03-06 LAB — POCT I-STAT, CHEM 8
BUN: 16 mg/dL (ref 6–20)
Calcium, Ion: 1.22 mmol/L (ref 1.15–1.40)
Chloride: 104 mmol/L (ref 98–111)
Creatinine, Ser: 0.8 mg/dL (ref 0.61–1.24)
Glucose, Bld: 104 mg/dL — ABNORMAL HIGH (ref 70–99)
HCT: 40 % (ref 39.0–52.0)
Hemoglobin: 13.6 g/dL (ref 13.0–17.0)
Potassium: 4.1 mmol/L (ref 3.5–5.1)
Sodium: 139 mmol/L (ref 135–145)
TCO2: 21 mmol/L — ABNORMAL LOW (ref 22–32)

## 2022-03-06 SURGERY — ABDOMINAL AORTOGRAM W/LOWER EXTREMITY
Anesthesia: LOCAL | Laterality: Bilateral

## 2022-03-06 MED ORDER — OXYCODONE HCL 5 MG PO TABS: 5.0000 mg | ORAL_TABLET | ORAL | Status: DC | PRN

## 2022-03-06 MED ORDER — FENTANYL CITRATE (PF) 100 MCG/2ML IJ SOLN
INTRAMUSCULAR | Status: AC
  Filled 2022-03-06: qty 2

## 2022-03-06 MED ORDER — SODIUM CHLORIDE 0.9% FLUSH: 3.0000 mL | Freq: Two times a day (BID) | INTRAVENOUS | Status: DC

## 2022-03-06 MED ORDER — MORPHINE SULFATE (PF) 2 MG/ML IV SOLN: 2.0000 mg | INTRAVENOUS | Status: DC | PRN

## 2022-03-06 MED ORDER — ONDANSETRON HCL 4 MG/2ML IJ SOLN: 4.0000 mg | Freq: Four times a day (QID) | INTRAMUSCULAR | Status: DC | PRN

## 2022-03-06 MED ORDER — IOHEXOL 350 MG/ML SOLN
80.0000 mL | Freq: Once | INTRAVENOUS | Status: AC | PRN
  Administered 2022-03-06: 80 mL via INTRAVENOUS

## 2022-03-06 MED ORDER — MIDAZOLAM HCL 2 MG/2ML IJ SOLN
INTRAMUSCULAR | Status: AC
  Filled 2022-03-06: qty 2

## 2022-03-06 MED ORDER — LABETALOL HCL 5 MG/ML IV SOLN: 10.0000 mg | INTRAVENOUS | Status: DC | PRN

## 2022-03-06 MED ORDER — HEPARIN (PORCINE) IN NACL 1000-0.9 UT/500ML-% IV SOLN
INTRAVENOUS | Status: DC | PRN
  Administered 2022-03-06 (×2): 500 mL

## 2022-03-06 MED ORDER — IODIXANOL 320 MG/ML IV SOLN
INTRAVENOUS | Status: DC | PRN
  Administered 2022-03-06: 95 mL

## 2022-03-06 MED ORDER — HYDRALAZINE HCL 20 MG/ML IJ SOLN: 5.0000 mg | INTRAMUSCULAR | Status: DC | PRN

## 2022-03-06 MED ORDER — SODIUM CHLORIDE 0.9% FLUSH: 3.0000 mL | INTRAVENOUS | Status: DC | PRN

## 2022-03-06 MED ORDER — LIDOCAINE HCL (PF) 1 % IJ SOLN
INTRAMUSCULAR | Status: DC | PRN
  Administered 2022-03-06: 12 mL

## 2022-03-06 MED ORDER — MIDAZOLAM HCL 2 MG/2ML IJ SOLN
INTRAMUSCULAR | Status: DC | PRN
  Administered 2022-03-06: 2 mg via INTRAVENOUS
  Administered 2022-03-06: 1 mg via INTRAVENOUS

## 2022-03-06 MED ORDER — SODIUM CHLORIDE 0.9 % WEIGHT BASED INFUSION
1.0000 mL/kg/h | INTRAVENOUS | Status: DC
  Administered 2022-03-06: 1 mL/kg/h via INTRAVENOUS

## 2022-03-06 MED ORDER — LIDOCAINE HCL (PF) 1 % IJ SOLN
INTRAMUSCULAR | Status: AC
  Filled 2022-03-06: qty 30

## 2022-03-06 MED ORDER — FENTANYL CITRATE (PF) 100 MCG/2ML IJ SOLN
INTRAMUSCULAR | Status: DC | PRN
Start: 2022-03-06 — End: 2022-03-06
  Administered 2022-03-06: 25 ug via INTRAVENOUS
  Administered 2022-03-06: 50 ug via INTRAVENOUS

## 2022-03-06 MED ORDER — SODIUM CHLORIDE 0.9 % IV SOLN: INTRAVENOUS | Status: DC

## 2022-03-06 MED ORDER — SODIUM CHLORIDE 0.9 % IV SOLN
250.0000 mL | INTRAVENOUS | Status: DC | PRN
Start: 2022-03-06 — End: 2022-03-06

## 2022-03-06 MED ORDER — ACETAMINOPHEN 325 MG PO TABS: 650.0000 mg | ORAL_TABLET | ORAL | Status: DC | PRN

## 2022-03-06 SURGICAL SUPPLY — 9 items
CATH OMNI FLUSH 5F 65CM (CATHETERS) ×1 IMPLANT
KIT MICROPUNCTURE NIT STIFF (SHEATH) ×1 IMPLANT
KIT PV (KITS) ×3 IMPLANT
SHEATH PINNACLE 5F 10CM (SHEATH) ×1 IMPLANT
SHEATH PROBE COVER 6X72 (BAG) ×1 IMPLANT
SYR MEDRAD MARK V 150ML (SYRINGE) ×1 IMPLANT
TRANSDUCER W/STOPCOCK (MISCELLANEOUS) ×3 IMPLANT
TRAY PV CATH (CUSTOM PROCEDURE TRAY) ×3 IMPLANT
WIRE BENTSON .035X145CM (WIRE) ×1 IMPLANT

## 2022-03-06 NOTE — Progress Notes (Signed)
Bilateral lower extremity vein mapping has been completed. Preliminary results can be found in CV Proc through chart review.   03/06/22 12:20 PM Olen Cordial RVT

## 2022-03-07 ENCOUNTER — Encounter (HOSPITAL_COMMUNITY): Payer: Self-pay | Admitting: Surgery

## 2022-03-08 ENCOUNTER — Other Ambulatory Visit: Payer: Self-pay

## 2022-03-08 ENCOUNTER — Telehealth: Payer: Self-pay

## 2022-03-08 DIAGNOSIS — I70212 Atherosclerosis of native arteries of extremities with intermittent claudication, left leg: Secondary | ICD-10-CM

## 2022-03-08 DIAGNOSIS — I70202 Unspecified atherosclerosis of native arteries of extremities, left leg: Secondary | ICD-10-CM

## 2022-03-08 NOTE — Telephone Encounter (Signed)
Pt is returning call and is requesting a call back.  

## 2022-03-08 NOTE — Telephone Encounter (Signed)
Returned call to patient and scheduled him for 03/15/22 at end of qtr day

## 2022-03-08 NOTE — Telephone Encounter (Signed)
Attempted to reach patient to schedule for appt. No answer and VM is full. Called wife and left message on her number. Awaiting call back.

## 2022-03-08 NOTE — Telephone Encounter (Signed)
-----   Message from Dewain Penning sent at 03/07/2022  1:32 PM EDT ----- Regarding: RE: Outpatient referral Unnamed Hino,  I have attached the pt.  Trish  ----- Message ----- From: Tonny Bollman, MD Sent: 03/07/2022   1:21 PM EDT To: Markham Jordan K Subject: RE: Outpatient referral                        Looping my nurse, Maralyn Sago, in to the conversation. Maralyn Sago can you look at my schedule and add him onto one of our normal overbook slots? thx ----- Message ----- From: Dewain Penning Sent: 03/07/2022   9:41 AM EDT To: Nada Libman, MD; Tonny Bollman, MD Subject: RE: Outpatient referral                        Yes, I was going to add him on next Wednesday. Just wanted to confirm a time with you first.  Trish ----- Message ----- From: Tonny Bollman, MD Sent: 03/07/2022   9:16 AM EDT To: Nada Libman, MD; Dewain Penning Subject: Outpatient referral                            Jannifer Hick did [facility] reach out to you a patient named Quackenbush that he did an angio on yesterday? I'd like to get him into my next office as an add-on to evaluate for cardiac source of embolism. thanks

## 2022-03-15 ENCOUNTER — Ambulatory Visit (INDEPENDENT_AMBULATORY_CARE_PROVIDER_SITE_OTHER): Payer: 59 | Admitting: Cardiovascular Disease

## 2022-03-15 ENCOUNTER — Encounter: Payer: Self-pay | Admitting: Cardiovascular Disease

## 2022-03-15 VITALS — BP 110/80 | HR 59 | Ht 76.0 in | Wt 216.6 lb

## 2022-03-15 DIAGNOSIS — I70202 Unspecified atherosclerosis of native arteries of extremities, left leg: Secondary | ICD-10-CM

## 2022-03-15 DIAGNOSIS — Z83438 Family history of other disorder of lipoprotein metabolism and other lipidemia: Secondary | ICD-10-CM | POA: Diagnosis not present

## 2022-03-15 DIAGNOSIS — Z01810 Encounter for preprocedural cardiovascular examination: Secondary | ICD-10-CM | POA: Diagnosis not present

## 2022-03-15 DIAGNOSIS — I739 Peripheral vascular disease, unspecified: Secondary | ICD-10-CM

## 2022-03-15 NOTE — Patient Instructions (Signed)
Medication Instructions:  Your physician recommends that you continue on your current medications as directed. Please refer to the Current Medication list given to you today.  *If you need a refill on your cardiac medications before your next appointment, please call your pharmacy*   Lab Work: Advanced LIPID panel, CBC, BMET If you have labs (blood work) drawn today and your tests are completely normal, you will receive your results only by: MyChart Message (if you have MyChart) OR A paper copy in the mail If you have any lab test that is abnormal or we need to change your treatment, we will call you to review the results.   Testing/Procedures: TEE (next week, inpatient) Your physician has requested that you have a TEE. During a TEE, sound waves are used to create images of your heart. It provides your doctor with information about the size and shape of your heart and how well your heart's chambers and valves are working. In this test, a transducer is attached to the end of a flexible tube that's guided down your throat and into your esophagus (the tube leading from you mouth to your stomach) to get a more detailed image of your heart. You are not awake for the procedure. Please see the instruction sheet given to you today. For further information please visit [email or web address]  Follow-Up: At [facility], you and your health needs are our priority.  As part of our continuing mission to provide you with exceptional heart care, we have created designated Provider Care Teams.  These Care Teams include your primary Cardiologist (physician) and Advanced Practice Providers (APPs -  Physician Assistants and Nurse Practitioners) who all work together to provide you with the care you need, when you need it.  Your next appointment:   As needed  Provider:   Tonny Bollman, MD}   Important Information About Sugar

## 2022-03-15 NOTE — Progress Notes (Signed)
Cardiology Office Note:    Date:  03/17/2022   ID:  Shawn Hall, DOB 08/24/1986, MRN [id number]  PCP:  Patient, No Pcp Per   [facility] Providers Cardiologist:  None     Referring MD: No ref. provider found   Chief Complaint  Patient presents with   leg pain    History of Present Illness:    Shawn Hall is a 36 y.o. male referred by Dr. Myra Gianotti for evaluation of cardiac source of embolism.  The patient was seen by Dr. Myra Gianotti on June 5 for evaluation of left leg pain.  He has previously undergone orthopedic and neurologic work-ups which were negative.  His evaluation demonstrated normal ABI and triphasic waveforms on the right.  However the left ABI was mildly abnormal at rest and showed a marked drop to 0.65 with exercise.  The patient is here alone today.  He has no history of cardiac problems.  He denies chest pain, chest pressure, shortness of breath, heart palpitations, lightheadedness, leg edema, orthopnea, or PND.  He has no personal history or family history of clotting disorder.  His father has extensive CAD and has undergone bypass surgery.  There is a family history of hyperlipidemia.  The patient was a Government social research officer.  He has been physically active until the past few years when he has developed lifestyle limiting claudication of the left leg.  He otherwise has no complaints.  Past Medical History:  Diagnosis Date   PAD (peripheral artery disease) (HCC)     Past Surgical History:  Procedure Laterality Date   ABDOMINAL AORTOGRAM W/LOWER EXTREMITY Bilateral 03/06/2022   Procedure: ABDOMINAL AORTOGRAM W/LOWER EXTREMITY;  Surgeon: Nada Libman, MD;  Location: [facility] INVASIVE CV LAB;  Service: Cardiovascular;  Laterality: Bilateral;   WISDOM TOOTH EXTRACTION      Current Medications: No outpatient medications have been marked as taking for the 03/15/22 encounter (Office Visit) with Tonny Bollman, MD.     Allergies:   Penicillins   Social History    Socioeconomic History   Marital status: Married    Spouse name: Not on file   Number of children: Not on file   Years of education: Not on file   Highest education level: Not on file  Occupational History   Not on file  Tobacco Use   Smoking status: Never   Smokeless tobacco: Not on file  Vaping Use   Vaping Use: Never used  Substance and Sexual Activity   Alcohol use: Not on file   Drug use: Never   Sexual activity: Not on file  Other Topics Concern   Not on file  Social History Narrative   Not on file   Social Determinants of Health   Financial Resource Strain: Not on file  Food Insecurity: Not on file  Transportation Needs: Not on file  Physical Activity: Not on file  Stress: Not on file  Social Connections: Not on file     Family History: The patient's family history includes Heart disease in his father.  ROS:   Please see the history of present illness.    All other systems reviewed and are negative.  EKGs/Labs/Other Studies Reviewed:    The following studies were reviewed today: CTA Chest/Abd/Pelvis: IMPRESSION: 1. No aortic dissection.  Normal thoracic and abdominal aorta. 2. Markedly distended urinary bladder. Correlate for possible urinary retention, as this may be a cause of the patient's discomfort in a postoperative setting. No other acute findings in the abdomen  or pelvis. 3. Note is made moderate narrowing of the proximal celiac artery compatible with median arcuate ligament compression, which may be an incidental finding in otherwise asymptomatic patient.  EKG:  EKG is not ordered today.  EKG dated 03/06/2022 shows sinus bradycardia 51 bpm, otherwise within normal limits  Recent Labs: 03/16/2022: BUN 16; Creatinine, Ser 1.03; Hemoglobin 15.0; Platelets 300; Potassium 4.4; Sodium 139  Recent Lipid Panel No results found for: "CHOL", "TRIG", "HDL", "CHOLHDL", "VLDL", "LDLCALC", "LDLDIRECT"   Risk Assessment/Calculations:            Physical Exam:    VS:  BP 110/80   Pulse (!) 59   Ht 6\' 4"  (1.93 m)   Wt 216 lb 9.6 oz (98.2 kg)   SpO2 97%   BMI 26.37 kg/m     Wt Readings from Last 3 Encounters:  03/15/22 216 lb 9.6 oz (98.2 kg)  03/06/22 215 lb (97.5 kg)  02/19/22 217 lb (98.4 kg)     GEN:  Well nourished, well developed in no acute distress HEENT: Normal NECK: No JVD; No carotid bruits LYMPHATICS: No lymphadenopathy CARDIAC: RRR, no murmurs, rubs, gallops RESPIRATORY:  Clear to auscultation without rales, wheezing or rhonchi  ABDOMEN: Soft, non-tender, non-distended MUSCULOSKELETAL:  No edema; No deformity  SKIN: Warm and dry NEUROLOGIC:  Alert and oriented x 3 PSYCHIATRIC:  Normal affect   ASSESSMENT:    1. Left popliteal artery occlusion (HCC)   2. Intermittent claudication (HCC)   3. PAD (peripheral artery disease) (HCC)   4. Family history of hyperlipidemia   5. Pre-procedural cardiovascular examination    PLAN:    In order of problems listed above:  Healthy 36 year old male with lifestyle limiting intermittent claudication secondary to left popliteal artery occlusion.  The patient has no risk factors for vascular disease.  It is appropriate to evaluate him for cardiac source of embolism.  He has undergone a CTA of the chest, abdomen, and pelvis, demonstrating no aortic pathology.  At his young age with lack of symptoms, I do not think he requires any screening for atrial fibrillation.  I think he should be evaluated with a transesophageal echocardiogram to evaluate for cardiac source of embolism (valvular pathology, LV dysfunction, PFO).  I have reviewed risks, indications, and alternatives to transesophageal echo with the patient.  He understands and agrees to proceed.  He also has a family history of hyperlipidemia and we will order a lipid profile to include an LP(a) as well as a standard lipid panel.      Shared Decision Making/Informed Consent The risks [esophageal damage, perforation  (1:10,000 risk), bleeding, pharyngeal hematoma as well as other potential complications associated with conscious sedation including aspiration, arrhythmia, respiratory failure and death], benefits (treatment guidance and diagnostic support) and alternatives of a transesophageal echocardiogram were discussed in detail with Shawn Hall and he is willing to proceed.     Medication Adjustments/Labs and Tests Ordered: Current medicines are reviewed at length with the patient today.  Concerns regarding medicines are outlined above.  Orders Placed This Encounter  Procedures   CBC   Basic metabolic panel   Lipoprotein A (LPA)   NMR, lipoprofile   Apolipoprotein B   EKG 12-Lead   No orders of the defined types were placed in this encounter.   Patient Instructions  Medication Instructions:  Your physician recommends that you continue on your current medications as directed. Please refer to the Current Medication list given to you today.  *If you need a refill  on your cardiac medications before your next appointment, please call your pharmacy*   Lab Work: Advanced LIPID panel, CBC, BMET If you have labs (blood work) drawn today and your tests are completely normal, you will receive your results only by: MyChart Message (if you have MyChart) OR A paper copy in the mail If you have any lab test that is abnormal or we need to change your treatment, we will call you to review the results.   Testing/Procedures: TEE (next week, inpatient) Your physician has requested that you have a TEE. During a TEE, sound waves are used to create images of your heart. It provides your doctor with information about the size and shape of your heart and how well your heart's chambers and valves are working. In this test, a transducer is attached to the end of a flexible tube that's guided down your throat and into your esophagus (the tube leading from you mouth to your stomach) to get a more detailed image of your  heart. You are not awake for the procedure. Please see the instruction sheet given to you today. For further information please visit [email or web address]  Follow-Up: At [facility], you and your health needs are our priority.  As part of our continuing mission to provide you with exceptional heart care, we have created designated Provider Care Teams.  These Care Teams include your primary Cardiologist (physician) and Advanced Practice Providers (APPs -  Physician Assistants and Nurse Practitioners) who all work together to provide you with the care you need, when you need it.  Your next appointment:   As needed  Provider:   Tonny Bollman, MD}   Important Information About Sugar         Signed, Tonny Bollman, MD  03/17/2022 2:58 PM    [facility]

## 2022-03-16 ENCOUNTER — Other Ambulatory Visit: Payer: 59 | Admitting: *Deleted

## 2022-03-16 DIAGNOSIS — Z83438 Family history of other disorder of lipoprotein metabolism and other lipidemia: Secondary | ICD-10-CM

## 2022-03-16 DIAGNOSIS — I739 Peripheral vascular disease, unspecified: Secondary | ICD-10-CM

## 2022-03-16 DIAGNOSIS — Z01810 Encounter for preprocedural cardiovascular examination: Secondary | ICD-10-CM

## 2022-03-17 LAB — BASIC METABOLIC PANEL
BUN/Creatinine Ratio: 16 (ref 9–20)
BUN: 16 mg/dL (ref 6–20)
CO2: 21 mmol/L (ref 20–29)
Calcium: 9.2 mg/dL (ref 8.7–10.2)
Chloride: 103 mmol/L (ref 96–106)
Creatinine, Ser: 1.03 mg/dL (ref 0.76–1.27)
Glucose: 99 mg/dL (ref 70–99)
Potassium: 4.4 mmol/L (ref 3.5–5.2)
Sodium: 139 mmol/L (ref 134–144)
eGFR: 96 mL/min/{1.73_m2} (ref >59)

## 2022-03-17 LAB — NMR, LIPOPROFILE
Cholesterol, Total: 207 mg/dL — ABNORMAL HIGH (ref 100–199)
HDL Particle Number: 36.1 umol/L (ref >=30.5)
HDL-C: 58 mg/dL (ref >39)
LDL Particle Number: 1286 nmol/L — ABNORMAL HIGH (ref <1000)
LDL Size: 21.3 nm (ref >20.5)
LDL-C (NIH Calc): 128 mg/dL — ABNORMAL HIGH (ref 0–99)
LP-IR Score: 35 (ref <=45)
Small LDL Particle Number: 557 nmol/L — ABNORMAL HIGH (ref <=527)
Triglycerides: 116 mg/dL (ref 0–149)

## 2022-03-17 LAB — CBC
Hematocrit: 44.5 % (ref 37.5–51.0)
Hemoglobin: 15 g/dL (ref 13.0–17.7)
MCH: 29.2 pg (ref 26.6–33.0)
MCHC: 33.7 g/dL (ref 31.5–35.7)
MCV: 87 fL (ref 79–97)
Platelets: 300 10*3/uL (ref 150–450)
RBC: 5.13 x10E6/uL (ref 4.14–5.80)
RDW: 12.7 % (ref 11.6–15.4)
WBC: 4.9 10*3/uL (ref 3.4–10.8)

## 2022-03-17 LAB — APOLIPOPROTEIN B: Apolipoprotein B: 99 mg/dL — ABNORMAL HIGH (ref <90)

## 2022-03-17 LAB — LIPOPROTEIN A (LPA): Lipoprotein (a): 113.5 nmol/L — ABNORMAL HIGH (ref <75.0)

## 2022-03-17 NOTE — Pre-Procedure Instructions (Signed)
Shawn Hall  03/17/2022     Surgical Instructions   Your procedure is scheduled on Wed., March 21, 2022 from 8:30AM-1:24PM.  Report to [facility] Main Entrance "A" at 6:30 A.M., then check in with the Admitting office.  Call this number if you have problems the morning of surgery:  [phone]   Remember:  Do not eat or drink after midnight on July 11th    Take these medicines the morning of surgery with A SIP OF WATER: As Needed: Acetaminophen (TYLENOL) Fexofenadine (ALLEGRA)  As of today, STOP taking any Aspirin (unless otherwise instructed by your surgeon) Aleve, Naproxen, Ibuprofen, Motrin, Advil, Goody's, BC's, all herbal medications, fish oil, and all vitamins.             Day of Surgery: Do not wear jewelry. Do not wear lotions, powders, perfumes/colognes, or deodorant. Do not shave 48 hours prior to surgery.  Men may shave face and neck. Do not bring valuables to the hospital.             [facility] is not responsible for any belongings or valuables.  Do NOT Smoke (Tobacco/Vaping)  24 hours prior to your procedure  If you use a CPAP at night, you may bring your mask and machine for your overnight stay.   Contacts, glasses, hearing aids, dentures or partials may not be worn into surgery, please bring cases for these belongings   For patients admitted to the hospital, discharge time will be determined by your treatment team.   Patients discharged the day of surgery will not be allowed to drive home, and someone needs to stay with them for 24 hours.   Special instructions:    Oral Hygiene is also important to reduce your risk of infection.  Remember - BRUSH YOUR TEETH THE MORNING OF SURGERY WITH YOUR REGULAR TOOTHPASTE   [place]- Preparing For Surgery  Before surgery, you can play an important role. Because skin is not sterile, your skin needs to be as free of germs as possible. You can reduce the number of germs on your skin by washing with CHG  (chlorahexidine gluconate) Soap before surgery.  CHG is an antiseptic cleaner which kills germs and bonds with the skin to continue killing germs even after washing.    Please do not use if you have an allergy to CHG or antibacterial soaps. If your skin becomes reddened/irritated stop using the CHG.  Do not shave (including legs and underarms) for at least 48 hours prior to first CHG shower. It is OK to shave your face.  Please follow these instructions carefully.    Shower the NIGHT BEFORE SURGERY and the MORNING OF SURGERY with CHG Soap.   If you chose to wash your hair, wash your hair first as usual with your normal shampoo. After you shampoo, rinse your hair and body thoroughly to remove the shampoo.  Then [employer] and genitals (private parts) with your normal soap and rinse thoroughly to remove soap.  After that Use CHG Soap as you would any other liquid soap. You can apply CHG directly to the skin and wash gently with a scrungie or a clean washcloth.   Apply the CHG Soap to your body ONLY FROM THE NECK DOWN.  Do not use on open wounds or open sores. Avoid contact with your eyes, ears, mouth and genitals (private parts). Wash Face and genitals (private parts)  with your normal soap.   Wash thoroughly, paying special attention to  the area where your surgery will be performed.  Thoroughly rinse your body with warm water from the neck down.  DO NOT shower/wash with your normal soap after using and rinsing off the CHG Soap.  Pat yourself dry with a CLEAN TOWEL.  Wear CLEAN PAJAMAS to bed the night before surgery  Place CLEAN SHEETS on your bed the night before your surgery  DO NOT SLEEP WITH PETS.  Reminder: Take a shower with CHG soap. Wear Clean/Comfortable clothing the morning of surgery Do not apply any deodorants/lotions.   Remember to brush your teeth WITH YOUR REGULAR TOOTHPASTE.  If you have been in contact with anyone that has tested positive in the last 10 days please  notify you surgeon.  Notify your provider:  if you develop a fever of 100.4 or greater, sneezing, cough, sore throat, shortness of breath or body aches.  NO VISITORS WILL BE ALLOWED IN PRE-OP WHERE PATIENTS ARE PREPPED FOR SURGERY.    SURGICAL WAITING ROOM VISITATION Patients having surgery or a procedure in a hospital may have two support people. Children under the age of 42 must have an adult with them who is not the patient. They may stay in the waiting area during the procedure and may switch out with other visitors. If the patient needs to stay at the hospital during part of their recovery, the visitor guidelines for inpatient rooms apply.  Please refer to the [facility] website for the visitor guidelines for Inpatients (after your surgery is over and you are in a regular room).   Please read over the following fact sheets that you were given.

## 2022-03-19 ENCOUNTER — Encounter (HOSPITAL_COMMUNITY): Payer: Self-pay

## 2022-03-19 ENCOUNTER — Other Ambulatory Visit: Payer: Self-pay

## 2022-03-19 ENCOUNTER — Encounter (HOSPITAL_COMMUNITY)
Admission: RE | Admit: 2022-03-19 | Discharge: 2022-03-19 | Disposition: A | Discharge status: Home / Self Care | Payer: 59 | Source: Ambulatory Visit | Attending: Surgery | Admitting: Surgery

## 2022-03-19 ENCOUNTER — Telehealth: Payer: Self-pay

## 2022-03-19 VITALS — BP 132/89 | HR 59 | Temp 97.6°F | Resp 17 | Ht 76.0 in | Wt 219.0 lb

## 2022-03-19 DIAGNOSIS — Z79899 Other long term (current) drug therapy: Secondary | ICD-10-CM

## 2022-03-19 DIAGNOSIS — E782 Mixed hyperlipidemia: Secondary | ICD-10-CM

## 2022-03-19 DIAGNOSIS — I70212 Atherosclerosis of native arteries of extremities with intermittent claudication, left leg: Secondary | ICD-10-CM | POA: Insufficient documentation

## 2022-03-19 DIAGNOSIS — Z01818 Encounter for other preprocedural examination: Secondary | ICD-10-CM

## 2022-03-19 DIAGNOSIS — Z01812 Encounter for preprocedural laboratory examination: Secondary | ICD-10-CM | POA: Insufficient documentation

## 2022-03-19 DIAGNOSIS — I70202 Unspecified atherosclerosis of native arteries of extremities, left leg: Secondary | ICD-10-CM

## 2022-03-19 LAB — TYPE AND SCREEN
ABO/RH(D): A POS
Antibody Screen: NEGATIVE

## 2022-03-19 LAB — PROTIME-INR
INR: 1 (ref 0.8–1.2)
Prothrombin Time: 13.1 seconds (ref 11.4–15.2)

## 2022-03-19 LAB — SURGICAL PCR SCREEN
MRSA, PCR: NEGATIVE
Staphylococcus aureus: POSITIVE — AB

## 2022-03-19 LAB — COMPREHENSIVE METABOLIC PANEL
ALT: 25 U/L (ref 0–44)
AST: 21 U/L (ref 15–41)
Albumin: 4.5 g/dL (ref 3.5–5.0)
Alkaline Phosphatase: 65 U/L (ref 38–126)
Anion gap: 11 (ref 5–15)
BUN: 17 mg/dL (ref 6–20)
CO2: 27 mmol/L (ref 22–32)
Calcium: 9.3 mg/dL (ref 8.9–10.3)
Chloride: 102 mmol/L (ref 98–111)
Creatinine, Ser: 0.97 mg/dL (ref 0.61–1.24)
GFR, Estimated: >60 mL/min (ref >60)
Glucose, Bld: 80 mg/dL (ref 70–99)
Potassium: 3.9 mmol/L (ref 3.5–5.1)
Sodium: 140 mmol/L (ref 135–145)
Total Bilirubin: 0.7 mg/dL (ref 0.3–1.2)
Total Protein: 7.4 g/dL (ref 6.5–8.1)

## 2022-03-19 LAB — URINALYSIS, ROUTINE W REFLEX MICROSCOPIC
Bilirubin Urine: NEGATIVE
Glucose, UA: NEGATIVE mg/dL
Hgb urine dipstick: NEGATIVE
Ketones, ur: NEGATIVE mg/dL
Leukocytes,Ua: NEGATIVE
Nitrite: NEGATIVE
Protein, ur: NEGATIVE mg/dL
Specific Gravity, Urine: 1.005 (ref 1.005–1.030)
pH: 7 (ref 5.0–8.0)

## 2022-03-19 LAB — APTT: aPTT: 29 seconds (ref 24–36)

## 2022-03-19 MED ORDER — ROSUVASTATIN CALCIUM 10 MG PO TABS: 10.0000 mg | ORAL_TABLET | Freq: Every day | ORAL | 3 refills | Status: DC

## 2022-03-19 NOTE — Telephone Encounter (Signed)
-----   Message from Tonny Bollman, MD sent at 03/17/2022  2:32 PM EDT ----- Lipids reviewed. Considering family hx and his personal hx of popliteal occlusion, favor initiation of a statin drug, rosuvastatin 10 mg daily. 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

## 2022-03-19 NOTE — Telephone Encounter (Signed)
Spoke with patient who agrees to Rosuvastatin therapy. Medication sent to pharmacy on file. Advised pt that we may likely do repeat labs in 3 months to verify efficacy, but will clarify with Cooper (advanced lipid panel vs just lipids and liver). Pt requests that if labs are needed, they be scheduled on a Wednesday or Thursday morning.

## 2022-03-19 NOTE — Progress Notes (Signed)
PCP - none Cardiologist - Tonny Bollman MD  PPM/ICD - denies Device Orders -  Rep Notified -   Chest x-ray - na EKG - 03/15/22 Stress Test - denies ECHO - denies Cardiac Cath - denies  Sleep Study - na CPAP -   Fasting Blood Sugar - na Checks Blood Sugar _____ times a day  Blood Thinner Instructions:na Aspirin Instructions:na  ERAS Protcol -no PRE-SURGERY Ensure or G2-   COVID TEST- na   Anesthesia review: no  Patient denies shortness of breath, fever, cough and chest pain at PAT appointment   All instructions explained to the patient, with a verbal understanding of the material. Patient agrees to go over the instructions while at home for a better understanding. The opportunity to ask questions was provided.

## 2022-03-20 NOTE — Telephone Encounter (Signed)
Order placed for repeat blood work and lab appt scheduled for 06/20/22. Notified pt via Mychart.

## 2022-03-20 NOTE — Telephone Encounter (Signed)
Yes - repeat lipid panel with LFT's. Don't need to do advanced lipids just regular panel. thx

## 2022-03-20 NOTE — Addendum Note (Signed)
Addended by: Lars Mage on: 03/20/2022 05:19 PM   Modules accepted: Orders

## 2022-03-21 ENCOUNTER — Inpatient Hospital Stay (HOSPITAL_COMMUNITY): Payer: 59 | Admitting: Certified Registered"

## 2022-03-21 ENCOUNTER — Encounter (HOSPITAL_COMMUNITY)
Admission: RE | Disposition: A | Discharge status: Home / Self Care | Payer: Self-pay | Source: Home / Self Care | Attending: Surgery

## 2022-03-21 ENCOUNTER — Encounter (HOSPITAL_COMMUNITY): Payer: Self-pay | Admitting: Surgery

## 2022-03-21 ENCOUNTER — Inpatient Hospital Stay (HOSPITAL_COMMUNITY)
Admission: RE | Admit: 2022-03-21 | Discharge: 2022-03-24 | DRG: 254 | Disposition: A | Discharge status: Home / Self Care | Payer: 59 | Attending: Surgery | Admitting: Surgery

## 2022-03-21 ENCOUNTER — Other Ambulatory Visit: Payer: Self-pay

## 2022-03-21 DIAGNOSIS — I739 Peripheral vascular disease, unspecified: Principal | ICD-10-CM | POA: Diagnosis present

## 2022-03-21 DIAGNOSIS — Z880 Allergy status to penicillin: Secondary | ICD-10-CM

## 2022-03-21 DIAGNOSIS — I70222 Atherosclerosis of native arteries of extremities with rest pain, left leg: Secondary | ICD-10-CM | POA: Diagnosis present

## 2022-03-21 DIAGNOSIS — D649 Anemia, unspecified: Secondary | ICD-10-CM | POA: Diagnosis not present

## 2022-03-21 DIAGNOSIS — I70212 Atherosclerosis of native arteries of extremities with intermittent claudication, left leg: Secondary | ICD-10-CM | POA: Diagnosis not present

## 2022-03-21 DIAGNOSIS — Q2112 Patent foramen ovale: Secondary | ICD-10-CM | POA: Diagnosis not present

## 2022-03-21 DIAGNOSIS — I70202 Unspecified atherosclerosis of native arteries of extremities, left leg: Secondary | ICD-10-CM | POA: Diagnosis not present

## 2022-03-21 HISTORY — PX: FEMORAL-TIBIAL BYPASS GRAFT: SHX938

## 2022-03-21 LAB — CBC
HCT: 35.9 % — ABNORMAL LOW (ref 39.0–52.0)
Hemoglobin: 12.4 g/dL — ABNORMAL LOW (ref 13.0–17.0)
MCH: 29.6 pg (ref 26.0–34.0)
MCHC: 34.5 g/dL (ref 30.0–36.0)
MCV: 85.7 fL (ref 80.0–100.0)
Platelets: 233 10*3/uL (ref 150–400)
RBC: 4.19 MIL/uL — ABNORMAL LOW (ref 4.22–5.81)
RDW: 12.1 % (ref 11.5–15.5)
WBC: 11.8 10*3/uL — ABNORMAL HIGH (ref 4.0–10.5)
nRBC: 0 % (ref 0.0–0.2)

## 2022-03-21 LAB — POCT I-STAT 7, (LYTES, BLD GAS, ICA,H+H)
Acid-base deficit: 1 mmol/L (ref 0.0–2.0)
Bicarbonate: 23.8 mmol/L (ref 20.0–28.0)
Calcium, Ion: 1.18 mmol/L (ref 1.15–1.40)
HCT: 38 % — ABNORMAL LOW (ref 39.0–52.0)
Hemoglobin: 12.9 g/dL — ABNORMAL LOW (ref 13.0–17.0)
O2 Saturation: 100 %
Patient temperature: 36.1
Potassium: 4.4 mmol/L (ref 3.5–5.1)
Sodium: 136 mmol/L (ref 135–145)
TCO2: 25 mmol/L (ref 22–32)
pCO2 arterial: 39.6 mmHg (ref 32–48)
pH, Arterial: 7.383 (ref 7.35–7.45)
pO2, Arterial: 172 mmHg — ABNORMAL HIGH (ref 83–108)

## 2022-03-21 LAB — CREATININE, SERUM
Creatinine, Ser: 0.94 mg/dL (ref 0.61–1.24)
GFR, Estimated: >60 mL/min (ref >60)

## 2022-03-21 LAB — POCT ACTIVATED CLOTTING TIME
Activated Clotting Time: 275 seconds
Activated Clotting Time: 197 seconds
Activated Clotting Time: 197 seconds

## 2022-03-21 LAB — GLUCOSE, CAPILLARY: Glucose-Capillary: 143 mg/dL — ABNORMAL HIGH (ref 70–99)

## 2022-03-21 LAB — ABO/RH: ABO/RH(D): A POS

## 2022-03-21 SURGERY — CREATION, BYPASS, ARTERIAL, FEMORAL TO TIBIAL, USING GRAFT
Anesthesia: General | Laterality: Left

## 2022-03-21 MED ORDER — PROPOFOL 10 MG/ML IV BOLUS
INTRAVENOUS | Status: DC | PRN
  Administered 2022-03-21: 150 mg via INTRAVENOUS

## 2022-03-21 MED ORDER — CHLORHEXIDINE GLUCONATE CLOTH 2 % EX PADS: 6.0000 | MEDICATED_PAD | Freq: Once | CUTANEOUS | Status: DC

## 2022-03-21 MED ORDER — ASPIRIN 81 MG PO TBEC
81.0000 mg | DELAYED_RELEASE_TABLET | Freq: Every day | ORAL | Status: DC
  Administered 2022-03-22 – 2022-03-24 (×3): 81 mg via ORAL
  Filled 2022-03-21 (×3): qty 1

## 2022-03-21 MED ORDER — ROCURONIUM BROMIDE 10 MG/ML (PF) SYRINGE
PREFILLED_SYRINGE | INTRAVENOUS | Status: AC
  Filled 2022-03-21: qty 10

## 2022-03-21 MED ORDER — ROSUVASTATIN CALCIUM 5 MG PO TABS
10.0000 mg | ORAL_TABLET | Freq: Every day | ORAL | Status: DC
  Filled 2022-03-21: qty 2

## 2022-03-21 MED ORDER — MIDAZOLAM HCL 2 MG/2ML IJ SOLN
INTRAMUSCULAR | Status: AC
  Filled 2022-03-21: qty 2

## 2022-03-21 MED ORDER — LIDOCAINE 2% (20 MG/ML) 5 ML SYRINGE
INTRAMUSCULAR | Status: AC
  Filled 2022-03-21: qty 5

## 2022-03-21 MED ORDER — EPHEDRINE 5 MG/ML INJ
INTRAVENOUS | Status: AC
  Filled 2022-03-21: qty 5

## 2022-03-21 MED ORDER — SODIUM CHLORIDE 0.9 % IV SOLN: INTRAVENOUS | Status: DC

## 2022-03-21 MED ORDER — DEXAMETHASONE SODIUM PHOSPHATE 10 MG/ML IJ SOLN
INTRAMUSCULAR | Status: DC | PRN
  Administered 2022-03-21: 5 mg via INTRAVENOUS

## 2022-03-21 MED ORDER — MAGNESIUM SULFATE 2 GM/50ML IV SOLN: 2.0000 g | Freq: Every day | INTRAVENOUS | Status: DC | PRN

## 2022-03-21 MED ORDER — PROTAMINE SULFATE 10 MG/ML IV SOLN
INTRAVENOUS | Status: DC | PRN
  Administered 2022-03-21: 50 mg via INTRAVENOUS

## 2022-03-21 MED ORDER — DEXAMETHASONE SODIUM PHOSPHATE 10 MG/ML IJ SOLN
INTRAMUSCULAR | Status: AC
  Filled 2022-03-21: qty 1

## 2022-03-21 MED ORDER — ACETAMINOPHEN 500 MG PO TABS: 1000.0000 mg | ORAL_TABLET | Freq: Every day | ORAL | Status: DC | PRN

## 2022-03-21 MED ORDER — HYDROMORPHONE HCL 1 MG/ML IJ SOLN
0.2500 mg | INTRAMUSCULAR | Status: DC | PRN
  Administered 2022-03-21 (×3): 0.25 mg via INTRAVENOUS

## 2022-03-21 MED ORDER — HYDROMORPHONE HCL 1 MG/ML IJ SOLN
INTRAMUSCULAR | Status: DC | PRN
  Administered 2022-03-21: .5 mg via INTRAVENOUS

## 2022-03-21 MED ORDER — CHLORHEXIDINE GLUCONATE 0.12 % MT SOLN
OROMUCOSAL | Status: AC
  Filled 2022-03-21: qty 15

## 2022-03-21 MED ORDER — LIDOCAINE 2% (20 MG/ML) 5 ML SYRINGE
INTRAMUSCULAR | Status: DC | PRN
  Administered 2022-03-21: 60 mg via INTRAVENOUS

## 2022-03-21 MED ORDER — ACETAMINOPHEN 650 MG RE SUPP: 325.0000 mg | RECTAL | Status: DC | PRN

## 2022-03-21 MED ORDER — HEPARIN 6000 UNIT IRRIGATION SOLUTION
Status: AC
  Filled 2022-03-21: qty 500

## 2022-03-21 MED ORDER — DEXMEDETOMIDINE (PRECEDEX) IN NS 20 MCG/5ML (4 MCG/ML) IV SYRINGE
PREFILLED_SYRINGE | INTRAVENOUS | Status: DC | PRN
  Administered 2022-03-21: 8 ug via INTRAVENOUS
  Administered 2022-03-21: 12 ug via INTRAVENOUS

## 2022-03-21 MED ORDER — POTASSIUM CHLORIDE CRYS ER 20 MEQ PO TBCR: 20.0000 meq | EXTENDED_RELEASE_TABLET | Freq: Every day | ORAL | Status: DC | PRN

## 2022-03-21 MED ORDER — FENTANYL CITRATE (PF) 250 MCG/5ML IJ SOLN
INTRAMUSCULAR | Status: AC
  Filled 2022-03-21: qty 5

## 2022-03-21 MED ORDER — OXYCODONE HCL 5 MG PO TABS
5.0000 mg | ORAL_TABLET | ORAL | Status: DC | PRN
  Administered 2022-03-22 (×3): 10 mg via ORAL
  Administered 2022-03-22: 5 mg via ORAL
  Administered 2022-03-23 – 2022-03-24 (×8): 10 mg via ORAL
  Filled 2022-03-21 (×3): qty 2
  Filled 2022-03-21: qty 1
  Filled 2022-03-21 (×8): qty 2

## 2022-03-21 MED ORDER — GUAIFENESIN-DM 100-10 MG/5ML PO SYRP: 15.0000 mL | ORAL_SOLUTION | ORAL | Status: DC | PRN

## 2022-03-21 MED ORDER — SENNOSIDES-DOCUSATE SODIUM 8.6-50 MG PO TABS: 1.0000 | ORAL_TABLET | Freq: Every evening | ORAL | Status: DC | PRN

## 2022-03-21 MED ORDER — LORATADINE 10 MG PO TABS
10.0000 mg | ORAL_TABLET | Freq: Every day | ORAL | Status: DC
  Administered 2022-03-22 – 2022-03-24 (×3): 10 mg via ORAL
  Filled 2022-03-21 (×3): qty 1

## 2022-03-21 MED ORDER — ONDANSETRON HCL 4 MG/2ML IJ SOLN
INTRAMUSCULAR | Status: DC | PRN
  Administered 2022-03-21: 4 mg via INTRAVENOUS

## 2022-03-21 MED ORDER — ONDANSETRON HCL 4 MG/2ML IJ SOLN: 4.0000 mg | Freq: Four times a day (QID) | INTRAMUSCULAR | Status: DC | PRN

## 2022-03-21 MED ORDER — VANCOMYCIN HCL IN DEXTROSE 1-5 GM/200ML-% IV SOLN
1000.0000 mg | INTRAVENOUS | Status: AC
  Administered 2022-03-21: 1000 mg via INTRAVENOUS
  Filled 2022-03-21: qty 200

## 2022-03-21 MED ORDER — HEPARIN SODIUM (PORCINE) 5000 UNIT/ML IJ SOLN
5000.0000 [IU] | Freq: Three times a day (TID) | INTRAMUSCULAR | Status: DC
  Administered 2022-03-21 – 2022-03-24 (×8): 5000 [IU] via SUBCUTANEOUS
  Filled 2022-03-21 (×8): qty 1

## 2022-03-21 MED ORDER — FENTANYL CITRATE (PF) 250 MCG/5ML IJ SOLN
INTRAMUSCULAR | Status: DC | PRN
  Administered 2022-03-21 (×2): 50 ug via INTRAVENOUS
  Administered 2022-03-21: 100 ug via INTRAVENOUS
  Administered 2022-03-21: 50 ug via INTRAVENOUS

## 2022-03-21 MED ORDER — BISACODYL 5 MG PO TBEC: 5.0000 mg | DELAYED_RELEASE_TABLET | Freq: Every day | ORAL | Status: DC | PRN

## 2022-03-21 MED ORDER — EPHEDRINE SULFATE-NACL 50-0.9 MG/10ML-% IV SOSY
PREFILLED_SYRINGE | INTRAVENOUS | Status: DC | PRN
  Administered 2022-03-21: 5 mg via INTRAVENOUS

## 2022-03-21 MED ORDER — ALUM & MAG HYDROXIDE-SIMETH 200-200-20 MG/5ML PO SUSP: 15.0000 mL | ORAL | Status: DC | PRN

## 2022-03-21 MED ORDER — SODIUM CHLORIDE 0.9 % IV SOLN: 500.0000 mL | Freq: Once | INTRAVENOUS | Status: DC | PRN

## 2022-03-21 MED ORDER — LACTATED RINGERS IV SOLN: INTRAVENOUS | Status: DC | PRN

## 2022-03-21 MED ORDER — PROPOFOL 10 MG/ML IV BOLUS
INTRAVENOUS | Status: AC
  Filled 2022-03-21: qty 20

## 2022-03-21 MED ORDER — MIDAZOLAM HCL 2 MG/2ML IJ SOLN
INTRAMUSCULAR | Status: DC | PRN
  Administered 2022-03-21: 2 mg via INTRAVENOUS

## 2022-03-21 MED ORDER — HYDROMORPHONE HCL 1 MG/ML IJ SOLN
INTRAMUSCULAR | Status: AC
  Filled 2022-03-21: qty 0.5

## 2022-03-21 MED ORDER — HYDROMORPHONE HCL 1 MG/ML IJ SOLN
INTRAMUSCULAR | Status: AC
  Filled 2022-03-21: qty 1

## 2022-03-21 MED ORDER — ALBUMIN HUMAN 5 % IV SOLN: INTRAVENOUS | Status: DC | PRN

## 2022-03-21 MED ORDER — PANTOPRAZOLE SODIUM 40 MG PO TBEC
40.0000 mg | DELAYED_RELEASE_TABLET | Freq: Every day | ORAL | Status: DC
  Administered 2022-03-21 – 2022-03-24 (×4): 40 mg via ORAL
  Filled 2022-03-21 (×4): qty 1

## 2022-03-21 MED ORDER — 0.9 % SODIUM CHLORIDE (POUR BTL) OPTIME
TOPICAL | Status: DC | PRN
  Administered 2022-03-21: 2000 mL

## 2022-03-21 MED ORDER — SUGAMMADEX SODIUM 200 MG/2ML IV SOLN
INTRAVENOUS | Status: DC | PRN
  Administered 2022-03-21: 200 mg via INTRAVENOUS

## 2022-03-21 MED ORDER — HYDROMORPHONE HCL 1 MG/ML IJ SOLN
0.5000 mg | INTRAMUSCULAR | Status: DC | PRN
  Administered 2022-03-21 – 2022-03-22 (×5): 0.5 mg via INTRAVENOUS
  Filled 2022-03-21: qty 0.5
  Filled 2022-03-21: qty 1
  Filled 2022-03-21 (×2): qty 0.5

## 2022-03-21 MED ORDER — METOPROLOL TARTRATE 5 MG/5ML IV SOLN: 2.0000 mg | INTRAVENOUS | Status: DC | PRN

## 2022-03-21 MED ORDER — VANCOMYCIN HCL IN DEXTROSE 1-5 GM/200ML-% IV SOLN
1000.0000 mg | Freq: Two times a day (BID) | INTRAVENOUS | Status: AC
  Administered 2022-03-21 – 2022-03-22 (×2): 1000 mg via INTRAVENOUS
  Filled 2022-03-21 (×2): qty 200

## 2022-03-21 MED ORDER — HEPARIN SODIUM (PORCINE) 1000 UNIT/ML IJ SOLN
INTRAMUSCULAR | Status: DC | PRN
  Administered 2022-03-21 (×2): 1000 [IU] via INTRAVENOUS
  Administered 2022-03-21 (×2): 2000 [IU] via INTRAVENOUS
  Administered 2022-03-21: 10000 [IU] via INTRAVENOUS

## 2022-03-21 MED ORDER — HEMOSTATIC AGENTS (NO CHARGE) OPTIME
TOPICAL | Status: DC | PRN
  Administered 2022-03-21 (×2): 1 via TOPICAL

## 2022-03-21 MED ORDER — ACETAMINOPHEN 10 MG/ML IV SOLN
INTRAVENOUS | Status: DC | PRN
  Administered 2022-03-21 (×2): 1000 mg via INTRAVENOUS

## 2022-03-21 MED ORDER — LABETALOL HCL 5 MG/ML IV SOLN: 10.0000 mg | INTRAVENOUS | Status: DC | PRN

## 2022-03-21 MED ORDER — PHENOL 1.4 % MT LIQD
1.0000 | OROMUCOSAL | Status: DC | PRN
Start: 2022-03-21 — End: 2022-03-24

## 2022-03-21 MED ORDER — HYDRALAZINE HCL 20 MG/ML IJ SOLN: 5.0000 mg | INTRAMUSCULAR | Status: DC | PRN

## 2022-03-21 MED ORDER — PROPOFOL 10 MG/ML IV BOLUS
INTRAVENOUS | Status: AC
Start: 2022-03-21 — End: ?
  Filled 2022-03-21: qty 20

## 2022-03-21 MED ORDER — ACETAMINOPHEN 325 MG PO TABS
325.0000 mg | ORAL_TABLET | ORAL | Status: DC | PRN
  Administered 2022-03-21 – 2022-03-23 (×7): 650 mg via ORAL
  Administered 2022-03-23: 325 mg via ORAL
  Administered 2022-03-23 – 2022-03-24 (×3): 650 mg via ORAL
  Filled 2022-03-21 (×11): qty 2

## 2022-03-21 MED ORDER — ROCURONIUM BROMIDE 10 MG/ML (PF) SYRINGE
PREFILLED_SYRINGE | INTRAVENOUS | Status: DC | PRN
  Administered 2022-03-21: 50 mg via INTRAVENOUS
  Administered 2022-03-21: 30 mg via INTRAVENOUS
  Administered 2022-03-21: 50 mg via INTRAVENOUS
  Administered 2022-03-21: 30 mg via INTRAVENOUS

## 2022-03-21 MED ORDER — DOCUSATE SODIUM 100 MG PO CAPS
100.0000 mg | ORAL_CAPSULE | Freq: Every day | ORAL | Status: DC
  Administered 2022-03-22 – 2022-03-24 (×3): 100 mg via ORAL
  Filled 2022-03-21 (×3): qty 1

## 2022-03-21 MED ORDER — PHENYLEPHRINE 80 MCG/ML (10ML) SYRINGE FOR IV PUSH (FOR BLOOD PRESSURE SUPPORT)
PREFILLED_SYRINGE | INTRAVENOUS | Status: DC | PRN
  Administered 2022-03-21 (×2): 160 ug via INTRAVENOUS
  Administered 2022-03-21 (×3): 80 ug via INTRAVENOUS

## 2022-03-21 MED ORDER — HEPARIN 6000 UNIT IRRIGATION SOLUTION
Status: DC | PRN
  Administered 2022-03-21: 1

## 2022-03-21 SURGICAL SUPPLY — 77 items
ADH SKN CLS APL DERMABOND .7 (GAUZE/BANDAGES/DRESSINGS) ×1
AGENT HMST KT MTR STRL THRMB (HEMOSTASIS) ×2
BAG COUNTER SPONGE SURGICOUNT (BAG) ×2 IMPLANT
BAG ISL DRAPE 18X18 STRL (DRAPES) ×1
BAG ISOLATION DRAPE 18X18 (DRAPES) IMPLANT
BAG SPNG CNTER NS LX DISP (BAG)
BANDAGE ESMARK 6X9 LF (GAUZE/BANDAGES/DRESSINGS) IMPLANT
BNDG CMPR 9X6 STRL LF SNTH (GAUZE/BANDAGES/DRESSINGS) ×2
BNDG ESMARK 6X9 LF (GAUZE/BANDAGES/DRESSINGS) ×4
CANISTER SUCT 3000ML PPV (MISCELLANEOUS) ×3 IMPLANT
CANNULA VESSEL 3MM 2 BLNT TIP (CANNULA) ×3 IMPLANT
CLIP TI MEDIUM 24 (CLIP) ×1 IMPLANT
CLIP TI WIDE RED SMALL 24 (CLIP) ×1 IMPLANT
COVER PROBE W GEL 5X96 (DRAPES) ×4 IMPLANT
COVER ULTRASOUND SURGICAL (MISCELLANEOUS) ×1 IMPLANT
CUFF TOURN SGL QUICK 24 (TOURNIQUET CUFF)
CUFF TOURN SGL QUICK 34 (TOURNIQUET CUFF)
CUFF TOURN SGL QUICK 42 (TOURNIQUET CUFF) IMPLANT
CUFF TRNQT CYL 24X4X16.5-23 (TOURNIQUET CUFF) IMPLANT
CUFF TRNQT CYL 34X4.125X (TOURNIQUET CUFF) IMPLANT
DERMABOND ADVANCED (GAUZE/BANDAGES/DRESSINGS) ×1
DERMABOND ADVANCED .7 DNX12 (GAUZE/BANDAGES/DRESSINGS) ×2 IMPLANT
DRAIN CHANNEL 15F RND FF W/TCR (WOUND CARE) IMPLANT
DRAPE HALF SHEET 40X57 (DRAPES) ×2 IMPLANT
DRAPE ISOLATION BAG 18X18 (DRAPES) ×2
DRAPE X-RAY CASS 24X20 (DRAPES) IMPLANT
ELECT REM PT RETURN 9FT ADLT (ELECTROSURGICAL) ×2
ELECTRODE REM PT RTRN 9FT ADLT (ELECTROSURGICAL) ×2 IMPLANT
EVACUATOR SILICONE 100CC (DRAIN) IMPLANT
GLOVE BIO SURGEON STRL SZ 6.5 (GLOVE) ×2 IMPLANT
GLOVE BIOGEL PI IND STRL 6 (GLOVE) IMPLANT
GLOVE BIOGEL PI IND STRL 7.5 (GLOVE) IMPLANT
GLOVE BIOGEL PI IND STRL 8 (GLOVE) IMPLANT
GLOVE BIOGEL PI IND STRL 9 (GLOVE) IMPLANT
GLOVE BIOGEL PI INDICATOR 6 (GLOVE) ×1
GLOVE BIOGEL PI INDICATOR 7.5 (GLOVE) ×2
GLOVE BIOGEL PI INDICATOR 8 (GLOVE) ×1
GLOVE BIOGEL PI INDICATOR 9 (GLOVE) ×1
GLOVE ECLIPSE 7.0 STRL STRAW (GLOVE) ×1 IMPLANT
GLOVE SURG SS PI 6.5 STRL IVOR (GLOVE) ×1 IMPLANT
GLOVE SURG SS PI 7.5 STRL IVOR (GLOVE) ×10 IMPLANT
GOWN STRL REUS W/ TWL LRG LVL3 (GOWN DISPOSABLE) ×4 IMPLANT
GOWN STRL REUS W/ TWL XL LVL3 (GOWN DISPOSABLE) ×2 IMPLANT
GOWN STRL REUS W/TWL LRG LVL3 (GOWN DISPOSABLE) ×4
GOWN STRL REUS W/TWL XL LVL3 (GOWN DISPOSABLE) ×2
HEMOSTAT SNOW SURGICEL 2X4 (HEMOSTASIS) IMPLANT
KIT BASIN OR (CUSTOM PROCEDURE TRAY) ×3 IMPLANT
KIT TURNOVER KIT B (KITS) ×3 IMPLANT
MARKER GRAFT CORONARY BYPASS (MISCELLANEOUS) IMPLANT
NS IRRIG 1000ML POUR BTL (IV SOLUTION) ×6 IMPLANT
PACK PERIPHERAL VASCULAR (CUSTOM PROCEDURE TRAY) ×3 IMPLANT
PAD ARMBOARD 7.5X6 YLW CONV (MISCELLANEOUS) ×6 IMPLANT
SET COLLECT BLD 21X3/4 12 (NEEDLE) IMPLANT
SPONGE T-LAP 18X18 ~~LOC~~+RFID (SPONGE) ×1 IMPLANT
STOPCOCK 4 WAY LG BORE MALE ST (IV SETS) IMPLANT
SURGIFLO W/THROMBIN 8M KIT (HEMOSTASIS) ×2 IMPLANT
SUT ETHILON 3 0 PS 1 (SUTURE) IMPLANT
SUT MNCRL AB 3-0 PS2 18 (SUTURE) ×1 IMPLANT
SUT PROLENE 5 0 C 1 24 (SUTURE) ×6 IMPLANT
SUT PROLENE 6 0 BV (SUTURE) ×13 IMPLANT
SUT PROLENE 6 0 CC (SUTURE) IMPLANT
SUT PROLENE 7 0 BV 1 (SUTURE) ×3 IMPLANT
SUT SILK 2 0 SH (SUTURE) ×4 IMPLANT
SUT SILK 3 0 (SUTURE) ×4
SUT SILK 3-0 18XBRD TIE 12 (SUTURE) IMPLANT
SUT VIC AB 2-0 CT1 27 (SUTURE) ×4
SUT VIC AB 2-0 CT1 TAPERPNT 27 (SUTURE) ×4 IMPLANT
SUT VIC AB 3-0 SH 27 (SUTURE) ×6
SUT VIC AB 3-0 SH 27X BRD (SUTURE) ×4 IMPLANT
SUT VICRYL 4-0 PS2 18IN ABS (SUTURE) ×6 IMPLANT
TAPE STRIPS DRAPE STRL (GAUZE/BANDAGES/DRESSINGS) ×1 IMPLANT
TAPE UMBILICAL 1/8X30 (MISCELLANEOUS) ×1 IMPLANT
TOWEL GREEN STERILE (TOWEL DISPOSABLE) ×3 IMPLANT
TRAY FOLEY MTR SLVR 16FR STAT (SET/KITS/TRAYS/PACK) ×3 IMPLANT
TUBING EXTENTION W/L.L. (IV SETS) IMPLANT
UNDERPAD 30X36 HEAVY ABSORB (UNDERPADS AND DIAPERS) ×3 IMPLANT
WATER STERILE IRR 1000ML POUR (IV SOLUTION) ×3 IMPLANT

## 2022-03-21 NOTE — Anesthesia Preprocedure Evaluation (Addendum)
Anesthesia Evaluation  Patient identified by MRN, date of birth, ID band Patient awake    Reviewed: Allergy & Precautions, NPO status , Patient's Chart, lab work & pertinent test results  Airway Mallampati: II  TM Distance: >3 FB     Dental   Pulmonary neg pulmonary ROS,    breath sounds clear to auscultation       Cardiovascular + Peripheral Vascular Disease   Rhythm:Regular Rate:Normal     Neuro/Psych    GI/Hepatic negative GI ROS, Neg liver ROS,   Endo/Other  negative endocrine ROS  Renal/GU negative Renal ROS     Musculoskeletal   Abdominal   Peds  Hematology negative hematology ROS (+)   Anesthesia Other Findings   Reproductive/Obstetrics                             Anesthesia Physical Anesthesia Plan  ASA: 3  Anesthesia Plan: General   Post-op Pain Management:    Induction: Intravenous  PONV Risk Score and Plan: 2  Airway Management Planned: Oral ETT  Additional Equipment:   Intra-op Plan:   Post-operative Plan: Possible Post-op intubation/ventilation  Informed Consent:   Plan Discussed with: Anesthesiologist and CRNA  Anesthesia Plan Comments:         Anesthesia Quick Evaluation

## 2022-03-21 NOTE — Op Note (Signed)
Patient name: Shawn Hall MRN: [id number] DOB: 10/20/1985 Sex: male  03/21/2022 Pre-operative Diagnosis: Severe left leg claudication Post-operative diagnosis:  Same Surgeon:  Durene Cal Assistants:  Clinton Gallant, PA Procedure:   #1: Left distal superficial femoral artery to left anterior tibial artery bypass graft with reversed ipsilateral saphenous vein   #2: Left tibioperoneal trunk and peroneal artery endarterectomy with jump graft using saphenous vein from the femoral anterior tibial bypass graft to the tibioperoneal trunk extending onto the peroneal artery Anesthesia:  General Blood Loss:  see anesthesia record Specimens:  none  Findings: Excellent caliber saphenous vein measuring 4-5 mm.  The proximal anastomosis was to the distal superficial femoral artery which was a disease-free artery.  I performed a end to end anastomosis to the left anterior tibial artery via a medial approach.  I then performed a endarterectomy of the tibioperoneal trunk, proximal posterior tibial and peroneal artery.  A jump graft from the femoral anterior tibial graft beginning below the knee extended down to the tibioperoneal trunk and proximal peroneal artery  Indications: This is a 36 year old male with several year history of severe claudication.  He has undergone an extensive work-up to determine the etiology of his left popliteal occlusion.  This included a cardioembolic work-up, I hypercoagulable work-up, and a MRI to rule out nonvascular causes.  All of this has been unremarkable.  Because of the severity of his symptoms, he comes in today for surgical revascularization.  Procedure:  The patient was identified in the holding area and taken to [facility] OR ROOM 12  The patient was then placed supine on the table. general anesthesia was administered.  The patient was prepped and draped in the usual sterile fashion.  A time out was called and antibiotics were administered.  A PA was necessary to expedite the  procedure and assist with technical details.  Ultrasound was used to map the course of the saphenous vein in the left leg.  It was marked for location.  This was a excellent caliber vein measuring approximately 4 to 5 mm.  I first began by making a medial above-knee incision.  Cautery was used divide subcutaneous tissue down to the fascia which was opened with cautery.  I then dissected down into the popliteal space and identified the popliteal artery.  This did have a palpable pulse.  It was fairly scarred in, and so I proceeded with a more proximal dissection of the artery.  I took down the adductor canal with cautery and exposed a disease-free distal superficial femoral artery which was encircled with Vesseloops.  Through the same incision, I dissected out the saphenous vein.  This was fully mobilized, ligating side branches between silk ties.  The PA helped with the exposure by providing suction and retraction.  Next, I made a medial below-knee incision.  Cautery was used divide subcutaneous tissue down to the fascia which was opened with cautery.  I then entered the popliteal space.  The soleus attachments to the tibia were taken down with cautery.  The popliteal vein was retracted laterally and then I dissected out the below-knee popliteal artery.  This had a hardened and thick appearance.  I proceeded with distal dissection.  Identified the anterior tibial vein and ligated this between silk ties.  I was then able to isolate the anterior tibial artery.  The tibioperoneal trunk appeared softer than the popliteal artery.  I then fully dissected out the tibioperoneal trunk as well as the origins of the peroneal and  posterior tibial artery.  I then identified the saphenous vein within this incision.  This was fully mobilized, ligating side branches between silk ties.  I proceeded with dissection underneath the skin bridge between the 2 incisions and fully mobilized the vein.  I then made a incision more  proximal to the above-knee incision and dissected out an additional segment of the saphenous vein.  Again the PA helped with exposure by providing suction and retraction.  The vein was then ligated proximally and distally and removed.  It was prepared on the back table.  This distended nicely with heparin saline to about 5 mm.  It was marked for orientation.  Next, I created a tunnel between the above-knee and below-knee incision within the popliteal space.  There did not appear to be any significant fibromuscular bands or attachments creating any form of compression.  At this point, the patient was fully heparinized.  Heparin levels were redosed appropriately based off of ACT measurements.  A tourniquet was then placed on the upper thigh.  The leg was exsanguinated with an Esmarch.  The tourniquet was taken to 250 mm of pressure.  I then opened the distal superficial femoral artery with a #11 blade and extended this with Potts scissors.  There was a normal lumen within the artery which was disease-free.  The vein was then placed in a reversed fashion and spatulated to fit the size the arteriotomy.  A running anastomosis was created with running 5-0 Prolene.  Once this was completed, the tourniquet was let down.  There was excellent pulsatile flow through the graft.  The vein was then brought through the previously created tunnel.  I then exsanguinated the leg again with an Esmarch and reinflated the tourniquet.  A #11 blade was used to make an arteriotomy within the popliteal artery at the origin of the anterior tibial artery.  The popliteal artery and proximal anterior tibial artery were occluded.  A few millimeters into the anterior tibial artery, it became patent and did have some backbleeding even under tourniquet.  The tibioperoneal trunk was softer but still appeared to have disease in it.  I therefore felt the best option was to preform a end to end anastomosis to the anterior tibial artery.  I therefore  transected the anterior tibial artery at the level where it was disease-free.  The leg was then straightened and the vein graft cut the appropriate length.  It was then spatulated.  I performed a end-to-end anastomosis between the vein graft and the anterior tibial artery with a running 6-0 Prolene.  Prior to completion the tourniquet was let down.  The appropriate flushing maneuvers were performed and the anastomosis was completed.  The patient had a brisk anterior tibial Doppler signal.  The PA helped by following suture for the anastomosis.  At this point, I elected to explore the tibioperoneal trunk.  A #11 blade was used to make an arteriotomy.  The tibioperoneal trunk appeared to be occluded.  I then used Pott scissors to open this onto the origin of the peroneal artery which did appear to be patent.  I then performed endarterectomy of the tibioperoneal trunk.  I struggle to get a good distal endpoint in the peroneal artery and so I had to further dissect out the peroneal artery.  I was then able to transect the plaque in the peroneal artery which was tacked down with 7-0 Prolene.  I performed an eversion endarterectomy of the posterior tibial artery.  I still had  a decent length remaining vein segment.  Therefore, I elected to make a venotomy in the superficial femoral to anterior tibial bypass graft along the lateral aspect.  I took another segment of the vein and spatulated this to fit the size of the venotomy and performed a running end-to-side anastomosis with a 6-0 Prolene.  I then cut the vein graft to the appropriate length and spatulated the distal end to function as a patch which was sewn on in a end-to-side fashion from the tibioperoneal trunk down to the origin of the peroneal artery for a few millimeters.  Prior to completion, the tourniquet was let down and the appropriate flushing maneuvers were performed.  The anastomosis was then completed.  Several 6-0 Prolene repair sutures were placed on  the distal anastomosis for hemostasis.  At this point, the patient had brisk anterior tibial, peroneal and posterior tibial Doppler signals.  I also felt that there was a palpable dorsalis pedis pulse.  I was satisfied with these results.  The patient's heparin was reversed with 50 mg of protamine.  The wounds were irrigated.  Hemostasis was achieved.  Surgi-Flo was placed in the below-knee incision.  The proximal vein harvest incision was closed with a deep layer 3-0 Vicryl and a subcuticular 4-0 Monocryl.  The above-knee and below-knee incisions were closed by reapproximating the fascia with 2-0 Vicryl.  Subcutaneous tissue including the saphenous vein harvest tunnel were closed with 3-0 Vicryl followed by subcuticular 4-0 Monocryl.  Dermabond was placed on the incisions.  The patient was successfully extubated taken recovery room in stable condition.  There were no immediate complications.   Disposition: To PACU stable.   Juleen China, M.D., [facility] [place] Office: [phone] Pager:  [phone]

## 2022-03-21 NOTE — Progress Notes (Signed)
Patient attempted to walk to restroom after foley was removed.  After urinating, he became clammy and diaphoretic.  He was able to sit in a chair.  His vital signs were taken.  Once stable, he was moved back to bed.    He states he feels better after being able to eat/drink.

## 2022-03-21 NOTE — Transfer of Care (Signed)
Immediate Anesthesia Transfer of Care Note  Patient: Shawn Hall  Procedure(s) Performed: LEFT FEMORAL-TIBIAL ARTERY BYPASS GRAFT (Left)  Patient Location: PACU  Anesthesia Type:General  Level of Consciousness: awake and alert   Airway & Oxygen Therapy: Patient Spontanous Breathing and Patient connected to face mask oxygen  Post-op Assessment: Report given to RN and Post -op Vital signs reviewed and stable  Post vital signs: Reviewed and stable  Last Vitals:  Vitals Value Taken Time  BP 110/49 03/21/22 15:25  Temp    Pulse 81 03/21/22 15:28  Resp 18 03/21/22 15:28  SpO2 99 03/21/22 15:28    Last Pain:  Vitals:   03/21/22 0656  TempSrc:   PainSc: 0-No pain         Complications: No notable events documented.

## 2022-03-21 NOTE — Anesthesia Procedure Notes (Signed)
Procedure Name: Intubation Date/Time: 03/21/2022 8:40 AM  Performed by: Elliot Dally, CRNAPre-anesthesia Checklist: Patient identified, Emergency Drugs available, Suction available and Patient being monitored Patient Re-evaluated:Patient Re-evaluated prior to induction Oxygen Delivery Method: Circle System Utilized Preoxygenation: Pre-oxygenation with 100% oxygen Induction Type: IV induction Ventilation: Mask ventilation without difficulty Laryngoscope Size: Miller and 2 Grade View: Grade I Tube type: Oral Tube size: 7.5 mm Number of attempts: 1 Airway Equipment and Method: Stylet and Oral airway Placement Confirmation: ETT inserted through vocal cords under direct vision, positive ETCO2 and breath sounds checked- equal and bilateral Secured at: 24 cm Tube secured with: Tape Dental Injury: Teeth and Oropharynx as per pre-operative assessment

## 2022-03-21 NOTE — Anesthesia Postprocedure Evaluation (Signed)
Anesthesia Post Note  Patient: Shawn Hall  Procedure(s) Performed: LEFT FEMORAL-TIBIAL ARTERY BYPASS GRAFT (Left)     Patient location during evaluation: PACU Anesthesia Type: General Level of consciousness: awake Pain management: pain level controlled Vital Signs Assessment: post-procedure vital signs reviewed and stable Respiratory status: spontaneous breathing Postop Assessment: no apparent nausea or vomiting Anesthetic complications: no   No notable events documented.  Last Vitals:  Vitals:   03/21/22 1630 03/21/22 1645  BP: 119/81 123/77  Pulse: 63 64  Resp: 11 11  Temp:    SpO2: 100% 100%    Last Pain:  Vitals:   03/21/22 1645  TempSrc:   PainSc: Asleep                 Mckena Chern

## 2022-03-21 NOTE — Anesthesia Procedure Notes (Signed)
Arterial Line Insertion Start/End7/08/2022 8:00 AM, 03/21/2022 8:20 AM Performed by: Elliot Dally, CRNA, CRNA  Patient location: OR. Preanesthetic checklist: patient identified, IV checked, site marked, risks and benefits discussed, surgical consent, monitors and equipment checked, pre-op evaluation, timeout performed and anesthesia consent Left, radial was placed Catheter size: 20 G Hand hygiene performed , maximum sterile barriers used  and Seldinger technique used  Attempts: 1 Procedure performed without using ultrasound guided technique. Following insertion, dressing applied and Biopatch. Post procedure assessment: normal  Patient tolerated the procedure well with no immediate complications.

## 2022-03-21 NOTE — Progress Notes (Signed)
Status post left femoral-tibial bypass graft for severe claudication The patient is resting comfortably in his room on 4 E.  Pain appears to be adequately controlled.  He did have a near syncopal episode getting up to go to the bathroom.  This was associated with hypotension and dizziness.  He is now back in bed feeling normal with adequate blood pressure.  Minimal edema to the left leg.  The incisions are clean dry and intact.  There is no hematoma.  He has a palpable dorsalis pedis pulse.  Scheduled for TEE tomorrow Aspirin therapy and statin therapy have been initiated.  Durene Cal

## 2022-03-21 NOTE — H&P (Signed)
[facility]   Patient name: Shawn Hall MRN: [id number]        DOB: 10/13/1985          Sex: male     REASON FOR VISIT:      Follow up   HISOTRY OF PRESENT ILLNESS:      Shawn Hall is a 36 y.o. male who I saw last week for evaluation of left leg pain that has been getting worse over the past several years.  He gets pain and numbness going down his leg with minimal activity.  Resting alleviates his symptoms.  He has undergone an orthopedic and neurologic work-up that has been unremarkable.  I performed exercise testing last week and he had a drop in his ABIs.  I sent him for a MR a/MRI to evaluate for popliteal entrapment or adventitial cystic disease.  He is back today to discuss these results.     PAST MEDICAL HISTORY:    History reviewed. No pertinent past medical history.     FAMILY HISTORY:    History reviewed. No pertinent family history.   SOCIAL HISTORY:    Social History        Tobacco Use   Smoking status: Never   Smokeless tobacco: Not on file  Substance Use Topics   Alcohol use: Not on file        ALLERGIES:        Allergies  Allergen Reactions   Penicillins          CURRENT MEDICATIONS:    No current outpatient medications on file.    No current facility-administered medications for this visit.      REVIEW OF SYSTEMS:    [X]  denotes positive finding, [ ]  denotes negative finding Cardiac   Comments:  Chest pain or chest pressure:      Shortness of breath upon exertion:      Short of breath when lying flat:      Irregular heart rhythm:             Vascular      Pain in calf, thigh, or hip brought on by ambulation: x    Pain in feet at night that wakes you up from your sleep:       Blood clot in your veins:      Leg swelling:              Pulmonary      Oxygen at home:      Productive cough:       Wheezing:              Neurologic      Sudden weakness in arms or legs:        Sudden numbness in arms or legs:       Sudden onset of difficulty speaking or slurred speech:      Temporary loss of vision in one eye:       Problems with dizziness:              Gastrointestinal      Blood in stool:       Vomited blood:              Genitourinary      Burning when urinating:       Blood in urine:             Psychiatric      Major depression:  Hematologic      Bleeding problems:      Problems with blood clotting too easily:             Skin      Rashes or ulcers:             Constitutional      Fever or chills:          PHYSICAL EXAM:       Vitals:    02/19/22 1105  BP: (!) 139/91  Pulse: (!) 54  Resp: 20  Temp: 97.9 F (36.6 C)  SpO2: 99%  Weight: 217 lb (98.4 kg)  Height: 6\' 4"  (1.93 m)      GENERAL: The patient is a well-nourished male, in no acute distress. The vital signs are documented above. CARDIAC: There is a regular rate and rhythm.  PULMONARY: Non-labored respirations MUSCULOSKELETAL: There are no major deformities or cyanosis. NEUROLOGIC: No focal weakness or paresthesias are detected. SKIN: There are no ulcers or rashes noted. PSYCHIATRIC: The patient has a normal affect.   STUDIES:    I have reviewed the following MRI: 1. Positive for significant vascular abnormality on the left. 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 The runoff arteries reconstitute via collateral flow. This may represent sequelae of prior trauma or embolization. 2. Normal appearance of the right lower extremity arterial structures. 3. No evidence of popliteal artery entrapment or acute osseous abnormality.   MEDICAL ISSUES:    Left leg claudication: MRI shows popliteal artery occlusion on the left.  There are no significant muscular abnormalities that would be concerning for popliteal entrapment.  He denies any  significant history of trauma.  The etiology of his popliteal occlusion is not clear.  Could potentially be embolic.  We discussed proceeding with angiography on June 27.  This will be through a right femoral approach.  Based off these results, we will plan for surgical revascularization.  He will need vein mapping while he is in the hospital.       June 29, IV, MD, FACS [facility] [phone] Pager [phone]    Angio completed as well as cardiac evaluation.  Discussed tibial bypass with vein.  All questions answered  [phone]

## 2022-03-22 ENCOUNTER — Other Ambulatory Visit (HOSPITAL_COMMUNITY): Payer: 59

## 2022-03-22 ENCOUNTER — Ambulatory Visit (HOSPITAL_COMMUNITY): Admission: RE | Admit: 2022-03-22 | Payer: 59 | Source: Home / Self Care | Admitting: Cardiology

## 2022-03-22 ENCOUNTER — Encounter (HOSPITAL_COMMUNITY): Payer: Self-pay | Admitting: Surgery

## 2022-03-22 ENCOUNTER — Other Ambulatory Visit: Payer: Self-pay

## 2022-03-22 LAB — ANTITHROMBIN III: AntiThromb III Func: 101 % (ref 75–120)

## 2022-03-22 LAB — BASIC METABOLIC PANEL
Anion gap: 7 (ref 5–15)
BUN: 11 mg/dL (ref 6–20)
CO2: 22 mmol/L (ref 22–32)
Calcium: 8.2 mg/dL — ABNORMAL LOW (ref 8.9–10.3)
Chloride: 108 mmol/L (ref 98–111)
Creatinine, Ser: 0.81 mg/dL (ref 0.61–1.24)
GFR, Estimated: >60 mL/min (ref >60)
Glucose, Bld: 127 mg/dL — ABNORMAL HIGH (ref 70–99)
Potassium: 4.1 mmol/L (ref 3.5–5.1)
Sodium: 137 mmol/L (ref 135–145)

## 2022-03-22 LAB — LIPID PANEL
Cholesterol: 152 mg/dL (ref 0–200)
HDL: 49 mg/dL (ref >40)
LDL Cholesterol: 90 mg/dL (ref 0–99)
Total CHOL/HDL Ratio: 3.1 RATIO
Triglycerides: 67 mg/dL (ref <150)
VLDL: 13 mg/dL (ref 0–40)

## 2022-03-22 LAB — CBC
HCT: 33 % — ABNORMAL LOW (ref 39.0–52.0)
Hemoglobin: 11.2 g/dL — ABNORMAL LOW (ref 13.0–17.0)
MCH: 28.9 pg (ref 26.0–34.0)
MCHC: 33.9 g/dL (ref 30.0–36.0)
MCV: 85.3 fL (ref 80.0–100.0)
Platelets: 240 10*3/uL (ref 150–400)
RBC: 3.87 MIL/uL — ABNORMAL LOW (ref 4.22–5.81)
RDW: 12.4 % (ref 11.5–15.5)
WBC: 9.3 10*3/uL (ref 4.0–10.5)
nRBC: 0 % (ref 0.0–0.2)

## 2022-03-22 MED ORDER — ROSUVASTATIN CALCIUM 20 MG PO TABS: 20.0000 mg | ORAL_TABLET | Freq: Every day | ORAL | Status: DC

## 2022-03-22 MED ORDER — ROSUVASTATIN CALCIUM 20 MG PO TABS
20.0000 mg | ORAL_TABLET | Freq: Every day | ORAL | Status: DC
  Administered 2022-03-22 – 2022-03-24 (×3): 20 mg via ORAL
  Filled 2022-03-22 (×3): qty 1

## 2022-03-22 NOTE — Progress Notes (Addendum)
  Progress Note    03/22/2022 7:41 AM 1 Day Post-Op  Subjective:  Patient says he is feeling well. He reports some pain but says that the pain meds are helping.    Vitals:   03/22/22 0003 03/22/22 0446  BP: 117/74 134/79  Pulse: 65 74  Resp: 11 16  Temp: 98.3 F (36.8 C) 98.7 F (37.1 C)  SpO2: 100% 100%    Physical Exam: Cardiac:  regular Lungs:  nonlabored Incisions:  LLE incisions c/d/l with minimal swelling, no hematoma. Extremities:  Palpable left DP pulse. Foot warm and well perfused   CBC    Component Value Date/Time   WBC 9.3 03/22/2022 0059   RBC 3.87 (L) 03/22/2022 0059   HGB 11.2 (L) 03/22/2022 0059   HGB 15.0 03/16/2022 0806   HCT 33.0 (L) 03/22/2022 0059   HCT 44.5 03/16/2022 0806   PLT 240 03/22/2022 0059   PLT 300 03/16/2022 0806   MCV 85.3 03/22/2022 0059   MCV 87 03/16/2022 0806   MCH 28.9 03/22/2022 0059   MCHC 33.9 03/22/2022 0059   RDW 12.4 03/22/2022 0059   RDW 12.7 03/16/2022 0806    BMET    Component Value Date/Time   NA 137 03/22/2022 0059   NA 139 03/16/2022 0806   K 4.1 03/22/2022 0059   CL 108 03/22/2022 0059   CO2 22 03/22/2022 0059   GLUCOSE 127 (H) 03/22/2022 0059   BUN 11 03/22/2022 0059   BUN 16 03/16/2022 0806   CREATININE 0.81 03/22/2022 0059   CALCIUM 8.2 (L) 03/22/2022 0059   GFRNONAA >60 03/22/2022 0059    INR    Component Value Date/Time   INR 1.0 03/19/2022 1338     Intake/Output Summary (Last 24 hours) at 03/22/2022 0741 Last data filed at 03/22/2022 0558 Gross per 24 hour  Intake 3979.48 ml  Output 3475 ml  Net 504.48 ml     Assessment/Plan:  36 y.o. male is 1 day post-op, s/p: L femoral-tibial bypass graft for severe claudication   -LLE incisions c/d/l, no hematoma. Minimal swelling -Palpable DP pulse. Foot is warm and well perfused -Patient has been up and moving with his walker -Continue on aspirin and statin -TEE today   Loel Dubonnet, PA-C [facility] [phone] 03/22/2022 7:41 AM  I agree with the above.  Have seen and evaluated the patient.  He is postoperative day #1 from left femoral tibial bypass graft.  He did have an episode of near syncope last night but none further.  He has a palpable left dorsalis pedis pulse.  His calf is soft.  He has been started on a aspirin and statin therapy.  He was scheduled to have a TEE today, however he did have breakfast and so this may need to be delayed.  He will continue to work with physical therapy.  Hopefully he can discharge home within the next day or so.  I am also sending a hypercoagulable panel.  Durene Cal

## 2022-03-22 NOTE — Anesthesia Preprocedure Evaluation (Addendum)
Anesthesia Evaluation  Patient identified by MRN, date of birth, ID band Patient awake    Reviewed: Allergy & Precautions, NPO status , Patient's Chart, lab work & pertinent test results  Airway Mallampati: I  TM Distance: >3 FB     Dental no notable dental hx.    Pulmonary neg pulmonary ROS,    breath sounds clear to auscultation       Cardiovascular Exercise Tolerance: Good + Peripheral Vascular Disease (s/p fem-tib bypass on 03/21/22)   Rhythm:Regular Rate:Normal  EKG sinus brady   Neuro/Psych negative neurological ROS     GI/Hepatic negative GI ROS, Neg liver ROS,   Endo/Other  negative endocrine ROS  Renal/GU negative Renal ROS  negative genitourinary   Musculoskeletal negative musculoskeletal ROS (+)   Abdominal   Peds  Hematology  (+) Blood dyscrasia, anemia ,   Anesthesia Other Findings   Reproductive/Obstetrics negative OB ROS                          Anesthesia Physical  Anesthesia Plan  ASA: 2  Anesthesia Plan: MAC   Post-op Pain Management: Minimal or no pain anticipated   Induction: Intravenous  PONV Risk Score and Plan: 2 and Propofol infusion, Treatment may vary due to age or medical condition and TIVA  Airway Management Planned: Simple Face Mask, Natural Airway, Nasal Cannula and Mask  Additional Equipment: None  Intra-op Plan:   Post-operative Plan: Extubation in OR  Informed Consent: I have reviewed the patients History and Physical, chart, labs and discussed the procedure including the risks, benefits and alternatives for the proposed anesthesia with the patient or authorized representative who has indicated his/her understanding and acceptance.       Plan Discussed with: Anesthesiologist, CRNA and Surgeon  Anesthesia Plan Comments:        Anesthesia Quick Evaluation

## 2022-03-22 NOTE — Progress Notes (Signed)
OT Cancellation Note  Patient Details Name: Shawn Hall MRN: [id number] DOB: June 15, 1986   Cancelled Treatment:    Reason Eval/Treat Not Completed: OT screened, no needs identified, will sign off.    Zamirah Denny D Nazareth Norenberg 03/22/2022, 8:51 AM

## 2022-03-22 NOTE — Evaluation (Signed)
Physical Therapy Evaluation & Discharge Patient Details Name: Shawn Hall MRN: [id number] DOB: 27-Apr-1986 Today's Date: 03/22/2022  History of Present Illness  Pt is a 36 y.o. male with severe LLE claudication, admitted 03/21/22 for same day L femoral to anterior tibial bypass graft, LLE endarterectomy. Plan for TEE 7/13. PMH includes PAD.   Clinical Impression  Patient evaluated by Physical Therapy with no further acute PT needs identified. PTA, pt independent, works, lives with wife and three young children. Today, pt moving well with and without DME, though noted LLE pain and stiffness. Educ re: precautions, positioning, edema control, DVT prevention, therex (HEP for gentle AROM/stretch), activity recommendations, DME needs (pt agrees he does not need RW for d/c home). All education has been completed and the patient has no further questions. Acute PT is signing off. Thank you for this referral.     Recommendations for follow up therapy are one component of a multi-disciplinary discharge planning process, led by the attending physician.  Recommendations may be updated based on patient status, additional functional criteria and insurance authorization.  Follow Up Recommendations No PT follow up      Assistance Recommended at Discharge PRN  Patient can return home with the following  A little help with bathing/dressing/bathroom;Assistance with cooking/housework;Assist for transportation    Equipment Recommendations None recommended by PT  Recommendations for Other Services   Mobility Specialist   Functional Status Assessment N/A      Precautions / Restrictions Precautions Precautions: Fall Restrictions Weight Bearing Restrictions: No      Mobility  Bed Mobility Overal bed mobility: Modified Independent             General bed mobility comments: supine<>sit HOB elevated    Transfers Overall transfer level: Modified independent Equipment used: Rolling walker (2  wheels), None               General transfer comment: increased time and effort, decreased L knee flexion secondary to pain    Ambulation/Gait Ambulation/Gait assistance: Supervision Gait Distance (Feet): 160 Feet Assistive device: Rolling walker (2 wheels), IV Pole Gait Pattern/deviations: Step-to pattern, Step-through pattern, Decreased weight shift to left, Trunk flexed, Antalgic Gait velocity: Decreased     General Gait Details: slow, antalgic gait initially with RW secondary to pain, progressing to single UE support on IV pole, and able to take a few steps without UE support; pt notes increased L calf and thigh stiffness, but demonstrates good ability to reach L heel to floor for gait  Stairs Stairs:  (educ on technique with LLE pain; pt declines stair training)          Wheelchair Mobility    Modified Rankin (Stroke Patients Only)       Balance Overall balance assessment: Needs assistance   Sitting balance-Leahy Scale: Good     Standing balance support: No upper extremity supported, During functional activity Standing balance-Leahy Scale: Fair Standing balance comment: can take steps and static stand to void at toilet without UE support                             Pertinent Vitals/Pain Pain Assessment Pain Assessment: Faces Faces Pain Scale: Hurts even more Pain Location: LLE Pain Descriptors / Indicators: Discomfort, Tightness Pain Intervention(s): Monitored during session, Repositioned    Home Living Family/patient expects to be discharged to:: Private residence Living Arrangements: Spouse/significant other;Children Available Help at Discharge: Family;Available 24 hours/day Type of Home: House Home Access:  Stairs to enter Entrance Stairs-Rails: Right Entrance Stairs-Number of Steps: 2 Alternate Level Stairs-Number of Steps: filght Home Layout: Two level;Able to live on main level with bedroom/bathroom Home Equipment: None Additional  Comments: Wife works from home; 3 kids (ages 3-7)    Prior Function Prior Level of Function : Independent/Modified Independent;Working/employed;Driving             Mobility Comments: indep without DME; 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 ADLs Comments: indep     Higher education careers adviser        Extremity/Trunk Assessment   Upper Extremity Assessment Upper Extremity Assessment: Overall WFL for tasks assessed    Lower Extremity Assessment Lower Extremity Assessment: LLE deficits/detail LLE Deficits / Details: s/p LLE revascularization with expected post-op pain and limited ROM; functionally at least 3/5 strength throughout, although stiff from thigh and lower leg incisions    Cervical / Trunk Assessment Cervical / Trunk Assessment: Normal  Communication   Communication: No difficulties  Cognition Arousal/Alertness: Awake/alert Behavior During Therapy: WFL for tasks assessed/performed Overall Cognitive Status: Within Functional Limits for tasks assessed                                          General Comments General comments (skin integrity, edema, etc.): educ re: precautions, positioning, edema control, DVT prevention, therex (HEP for gentle AROM/stretch), activity recommendations, DME needs (pt agrees he does not need RW for d/c home). pt's dad Karren Burly) present and supportive    Exercises     Assessment/Plan    PT Assessment Patient does not need any further PT services  PT Problem List         PT Treatment Interventions      PT Goals (Current goals can be found in the Care Plan section)  Acute Rehab PT Goals PT Goal Formulation: All assessment and education complete, DC therapy    Frequency       Co-evaluation               AM-PAC PT "6 Clicks" Mobility  Outcome Measure Help needed turning from your back to your side while in a flat bed without using bedrails?: None Help needed moving from lying on your back to sitting on  the side of a flat bed without using bedrails?: None Help needed moving to and from a bed to a chair (including a wheelchair)?: None Help needed standing up from a chair using your arms (e.g., wheelchair or bedside chair)?: None Help needed to walk in hospital room?: A Little Help needed climbing 3-5 steps with a railing? : A Little 6 Click Score: 22    End of Session   Activity Tolerance: Patient tolerated treatment well Patient left: in bed;with call bell/phone within reach;with family/visitor present Nurse Communication: Mobility status PT Visit Diagnosis: Other abnormalities of gait and mobility (R26.89);Pain Pain - Right/Left: Left Pain - part of body: Leg    Time: 8127-5170 PT Time Calculation (min) (ACUTE ONLY): 26 min   Charges:   PT Evaluation $PT Eval Low Complexity: 1 Low        Ina Homes, PT, DPT Acute Rehabilitation Services  Personal: Secure Chat Rehab Office: [phone]  Malachy Chamber 03/22/2022, 9:18 AM

## 2022-03-22 NOTE — Progress Notes (Signed)
PHARMACIST LIPID MONITORING   Shawn Hall is a 37 y.o. male admitted on 03/21/2022 with ischemic leg.  Pharmacy has been consulted to optimize lipid-lowering therapy with the indication of secondary prevention for clinical ASCVD.  Recent Labs:  Lipid Panel (last 6 months):   Lab Results  Component Value Date   CHOL 152 03/22/2022   TRIG 67 03/22/2022   HDL 49 03/22/2022   CHOLHDL 3.1 03/22/2022   VLDL 13 03/22/2022   LDLCALC 90 03/22/2022    Hepatic function panel (last 6 months):   Lab Results  Component Value Date   AST 21 03/19/2022   ALT 25 03/19/2022   ALKPHOS 65 03/19/2022   BILITOT 0.7 03/19/2022    SCr (since admission):   Serum creatinine: 0.81 mg/dL 33/82/50 5397 Estimated creatinine clearance: 153.3 mL/min  Current therapy and lipid therapy tolerance Current lipid-lowering therapy: crestor 10mg  Previous lipid-lowering therapies (if applicable): none Documented or reported allergies or intolerances to lipid-lowering therapies (if applicable): none  Assessment:   LDL above goal, will increase to rosuvastatin 20mg  (high intensity).   Plan:    1.Statin intensity (high intensity recommended for all patients regardless of the LDL):  Add or increase statin to high intensity.  2.Add ezetimibe (if any one of the following):   Not indicated at this time.  3.Refer to lipid clinic:   No  4.Follow-up with:  Primary care provider - Patient, No Pcp Per  5.Follow-up labs after discharge:  Changes in lipid therapy were made. Check a lipid panel in 8-12 weeks then annually.     PharmD., BCPS Clinical Pharmacist 03/22/2022 10:09 AM

## 2022-03-23 ENCOUNTER — Inpatient Hospital Stay (HOSPITAL_COMMUNITY): Payer: 59

## 2022-03-23 ENCOUNTER — Encounter (HOSPITAL_COMMUNITY): Payer: Self-pay | Admitting: Surgery

## 2022-03-23 ENCOUNTER — Other Ambulatory Visit (HOSPITAL_COMMUNITY): Payer: Self-pay

## 2022-03-23 ENCOUNTER — Encounter (HOSPITAL_COMMUNITY)
Admission: RE | Disposition: A | Discharge status: Home / Self Care | Payer: Self-pay | Source: Home / Self Care | Attending: Surgery

## 2022-03-23 ENCOUNTER — Inpatient Hospital Stay (HOSPITAL_COMMUNITY): Payer: 59 | Admitting: Anesthesiology

## 2022-03-23 DIAGNOSIS — Q2112 Patent foramen ovale: Secondary | ICD-10-CM

## 2022-03-23 DIAGNOSIS — I70202 Unspecified atherosclerosis of native arteries of extremities, left leg: Secondary | ICD-10-CM

## 2022-03-23 DIAGNOSIS — D649 Anemia, unspecified: Secondary | ICD-10-CM

## 2022-03-23 HISTORY — PX: TEE WITHOUT CARDIOVERSION: SHX5443

## 2022-03-23 HISTORY — PX: BUBBLE STUDY: SHX6837

## 2022-03-23 LAB — CARDIOLIPIN ANTIBODIES, IGG, IGM, IGA
Anticardiolipin IgA: <9 APL U/mL (ref 0–11)
Anticardiolipin IgG: <9 GPL U/mL (ref 0–14)
Anticardiolipin IgM: <9 MPL U/mL (ref 0–12)

## 2022-03-23 LAB — PROTEIN S, TOTAL: Protein S Ag, Total: 64 % (ref 60–150)

## 2022-03-23 LAB — LUPUS ANTICOAGULANT PANEL
DRVVT: 38.6 s (ref 0.0–47.0)
PTT Lupus Anticoagulant: 33.1 s (ref 0.0–43.5)

## 2022-03-23 LAB — HOMOCYSTEINE: Homocysteine: 9.7 umol/L (ref 0.0–14.5)

## 2022-03-23 LAB — BETA-2-GLYCOPROTEIN I ABS, IGG/M/A
Beta-2 Glyco I IgG: <9 GPI IgG units (ref 0–20)
Beta-2-Glycoprotein I IgA: <9 GPI IgA units (ref 0–25)
Beta-2-Glycoprotein I IgM: <9 GPI IgM units (ref 0–32)

## 2022-03-23 LAB — PROTEIN S ACTIVITY: Protein S Activity: 56 % — ABNORMAL LOW (ref 63–140)

## 2022-03-23 LAB — PROTEIN C ACTIVITY: Protein C Activity: 95 % (ref 73–180)

## 2022-03-23 SURGERY — ECHOCARDIOGRAM, TRANSESOPHAGEAL
Anesthesia: Monitor Anesthesia Care

## 2022-03-23 MED ORDER — LIDOCAINE 2% (20 MG/ML) 5 ML SYRINGE
INTRAMUSCULAR | Status: DC | PRN
  Administered 2022-03-23: 100 mg via INTRAVENOUS

## 2022-03-23 MED ORDER — ASPIRIN 81 MG PO TBEC: 81.0000 mg | DELAYED_RELEASE_TABLET | Freq: Every day | ORAL | Status: AC

## 2022-03-23 MED ORDER — PROPOFOL 10 MG/ML IV BOLUS
INTRAVENOUS | Status: DC | PRN
  Administered 2022-03-23: 40 mg via INTRAVENOUS

## 2022-03-23 MED ORDER — ROSUVASTATIN CALCIUM 20 MG PO TABS
20.0000 mg | ORAL_TABLET | Freq: Every day | ORAL | 3 refills | Status: DC
  Filled 2022-03-23: qty 90, 90d supply, fill #0

## 2022-03-23 MED ORDER — PROPOFOL 500 MG/50ML IV EMUL
INTRAVENOUS | Status: DC | PRN
  Administered 2022-03-23: 150 ug/kg/min via INTRAVENOUS

## 2022-03-23 MED ORDER — BUTAMBEN-TETRACAINE-BENZOCAINE 2-2-14 % EX AERO
INHALATION_SPRAY | CUTANEOUS | Status: DC | PRN
  Administered 2022-03-23: 2 via TOPICAL

## 2022-03-23 MED ORDER — OXYCODONE-ACETAMINOPHEN 5-325 MG PO TABS
1.0000 | ORAL_TABLET | Freq: Four times a day (QID) | ORAL | 0 refills | Status: DC | PRN
  Filled 2022-03-23: qty 20, 5d supply, fill #0

## 2022-03-23 NOTE — Anesthesia Postprocedure Evaluation (Signed)
Anesthesia Post Note  Patient: Shawn Hall  Procedure(s) Performed: TRANSESOPHAGEAL ECHOCARDIOGRAM (TEE) BUBBLE STUDY     Patient location during evaluation: Endoscopy Anesthesia Type: MAC Level of consciousness: awake and alert Pain management: pain level controlled Vital Signs Assessment: post-procedure vital signs reviewed and stable Respiratory status: spontaneous breathing, nonlabored ventilation and respiratory function stable Cardiovascular status: blood pressure returned to baseline and stable Postop Assessment: no apparent nausea or vomiting Anesthetic complications: no   No notable events documented.  Last Vitals:  Vitals:   03/23/22 0900 03/23/22 0910  BP: 100/86 130/81  Pulse: 71 61  Resp: 17 15  Temp:    SpO2: 100% 100%    Last Pain:  Vitals:   03/23/22 1038  TempSrc:   PainSc: 3                  Candra R Luis Sami

## 2022-03-23 NOTE — Plan of Care (Signed)
  Problem: Activity: Goal: Ability to return to baseline activity level will improve Outcome: Progressing   Problem: Health Behavior/Discharge Planning: Goal: Ability to safely manage health-related needs after discharge will improve Outcome: Progressing   

## 2022-03-23 NOTE — Progress Notes (Addendum)
  Progress Note    03/23/2022 7:52 AM 2 Days Post-Op  Subjective:  Patient says he is feeling good, still in some pain. He has been moving around well    Vitals:   03/23/22 0152 03/23/22 0335  BP: 119/68 121/74  Pulse: 82 63  Resp: 13 14  Temp: 98.2 F (36.8 C) 98.4 F (36.9 C)  SpO2:  99%    Physical Exam: Cardiac:  regular Lungs:  nonlabored Incisions:  LLE incisions c/d/l with minimal swelling, no hematoma Extremities:  Palpable L DP pulse, foot is warm   CBC    Component Value Date/Time   WBC 9.3 03/22/2022 0059   RBC 3.87 (L) 03/22/2022 0059   HGB 11.2 (L) 03/22/2022 0059   HGB 15.0 03/16/2022 0806   HCT 33.0 (L) 03/22/2022 0059   HCT 44.5 03/16/2022 0806   PLT 240 03/22/2022 0059   PLT 300 03/16/2022 0806   MCV 85.3 03/22/2022 0059   MCV 87 03/16/2022 0806   MCH 28.9 03/22/2022 0059   MCHC 33.9 03/22/2022 0059   RDW 12.4 03/22/2022 0059   RDW 12.7 03/16/2022 0806    BMET    Component Value Date/Time   NA 137 03/22/2022 0059   NA 139 03/16/2022 0806   K 4.1 03/22/2022 0059   CL 108 03/22/2022 0059   CO2 22 03/22/2022 0059   GLUCOSE 127 (H) 03/22/2022 0059   BUN 11 03/22/2022 0059   BUN 16 03/16/2022 0806   CREATININE 0.81 03/22/2022 0059   CALCIUM 8.2 (L) 03/22/2022 0059   GFRNONAA >60 03/22/2022 0059    INR    Component Value Date/Time   INR 1.0 03/19/2022 1338     Intake/Output Summary (Last 24 hours) at 03/23/2022 0752 Last data filed at 03/23/2022 0010 Gross per 24 hour  Intake 330.58 ml  Output 0 ml  Net 330.58 ml     Assessment/Plan:  36 y.o. male is 2 days post op, s/p: L femoral-tibial bypass graft for severe claudication  -LLE incisions c/d/l, minimal swelling. No hematoma -Patient has been NPO since midnight for TEE today -Palpable L DP pulse, foot is warm -Continue aspirin and statin -Hopeful for discharge tomorrow   Shawn Hall, [place] [facility] [phone] 03/23/2022 7:52 AM  I agree  with the above.  Have seen and evaluate the patient.  He had his TEE today which I have been told was unremarkable.  Hypercoagulable labs are still pending.  He has been ambulatory with physical therapy.  On exam he has a palpable left dorsalis pedis pulse.  There is 1-2+ pitting edema.  His incisions are healing nicely.  We will anticipate discharge either this evening or tomorrow.  He will go home on 10 mg of Crestor and 81 mg of aspirin.  Shawn Hall

## 2022-03-23 NOTE — Interval H&P Note (Signed)
History and Physical Interval Note:  03/23/2022 8:11 AM  Shawn Hall  has presented today for surgery, with the diagnosis of RULE OUT PFO.  The various methods of treatment have been discussed with the patient and family. After consideration of risks, benefits and other options for treatment, the patient has consented to  Procedure(s) with comments: TRANSESOPHAGEAL ECHOCARDIOGRAM (TEE) (N/A) - PT. WILL BE ADMITTED ON 7/12 as a surgical intervention.  The patient's history has been reviewed, patient examined, no change in status, stable for surgery.  I have reviewed the patient's chart and labs.  Questions were answered to the patient's satisfaction.     Maisie Fus

## 2022-03-23 NOTE — Discharge Summary (Signed)
Discharge Summary     Shawn Hall [birth year] 36 y.o. male  [id number]  Admission Date: 03/21/2022  Discharge Date: 03/24/2022  Physician: Serafina Mitchell, MD  Admission Diagnosis: PAD (peripheral artery disease) ([place]) [I73.9]  HPI:   This is a 36 y.o. male who I saw last week for evaluation of left leg pain that has been getting worse over the past several years.  He gets pain and numbness going down his leg with minimal activity.  Resting alleviates his symptoms.  He has undergone an orthopedic and neurologic work-up that has been unremarkable.  I performed exercise testing last week and he had a drop in his ABIs.  I sent him for a MR a/MRI to evaluate for popliteal entrapment or adventitial cystic disease.  He is back today to discuss these results.  Hospital Course:  The patient was admitted to the hospital and taken to the operating room on 03/21/2022 and underwent: #1: Left distal superficial femoral artery to left anterior tibial artery bypass graft with reversed ipsilateral saphenous vein #2: Left tibioperoneal trunk and peroneal artery endarterectomy with jump graft using saphenous vein from the femoral anterior tibial bypass graft to the tibioperoneal trunk extending onto the peroneal artery    Findings: Excellent caliber saphenous vein measuring 4-5 mm.  The proximal anastomosis was to the distal superficial femoral artery which was a disease-free artery.  I performed a end to end anastomosis to the left anterior tibial artery via a medial approach.  I then performed a endarterectomy of the tibioperoneal trunk, proximal posterior tibial and peroneal artery.  A jump graft from the femoral anterior tibial graft beginning below the knee extended down to the tibioperoneal trunk and proximal peroneal artery  The pt tolerated the procedure well and was transported to the PACU in good condition.   By POD 1, he had a palpable DP pulse on the left.  He was able to ambulate.   Continue asa/statin.    Post operatively, his Crestor dose was increased to 20mg  daily.    POD 2, pt doing well.  He had his TEE today which I have been told was unremarkable.  Hypercoagulable labs are still pending.  He has been ambulatory with physical therapy.  On exam he has a palpable left dorsalis pedis pulse.  There is 1-2+ pitting edema.  His incisions are healing nicely.   POD 3, his Hypercoag and embolic w/u negative to date.  He continued to have a palpable left DP pulse and +LLE edema.    He is discharged home.    CBC    Component Value Date/Time   WBC 9.3 03/22/2022 0059   RBC 3.87 (L) 03/22/2022 0059   HGB 11.2 (L) 03/22/2022 0059   HGB 15.0 03/16/2022 0806   HCT 33.0 (L) 03/22/2022 0059   HCT 44.5 03/16/2022 0806   PLT 240 03/22/2022 0059   PLT 300 03/16/2022 0806   MCV 85.3 03/22/2022 0059   MCV 87 03/16/2022 0806   MCH 28.9 03/22/2022 0059   MCHC 33.9 03/22/2022 0059   RDW 12.4 03/22/2022 0059   RDW 12.7 03/16/2022 0806    BMET    Component Value Date/Time   NA 137 03/22/2022 0059   NA 139 03/16/2022 0806   K 4.1 03/22/2022 0059   CL 108 03/22/2022 0059   CO2 22 03/22/2022 0059   GLUCOSE 127 (H) 03/22/2022 0059   BUN 11 03/22/2022 0059   BUN 16 03/16/2022 0806   CREATININE 0.81 03/22/2022 0059  CALCIUM 8.2 (L) 03/22/2022 0059   GFRNONAA >60 03/22/2022 0059       Discharge Diagnosis:  PAD (peripheral artery disease) ([place]) [I73.9]  Secondary Diagnosis: Patient Active Problem List   Diagnosis Date Noted   PAD (peripheral artery disease) ([place]) 03/15/2022   Gastrocnemius strain, left 12/01/2014   Arch pain of left foot 12/01/2014   Left knee injury 04/22/2014   Past Medical History:  Diagnosis Date   PAD (peripheral artery disease) ([place])      Allergies as of 03/23/2022       Reactions   Penicillins Hives   Childhood reaction.        Medication List     TAKE these medications    acetaminophen 500 MG tablet Commonly known as:  TYLENOL Take 1,000 mg by mouth daily as needed for moderate pain.   aspirin EC 81 MG tablet Take 1 tablet (81 mg total) by mouth daily at 6 (six) AM. Swallow whole. Start taking on: March 24, 2022   fexofenadine 180 MG tablet Commonly known as: ALLEGRA Take 180 mg by mouth daily as needed for allergies or rhinitis.   oxyCODONE-acetaminophen 5-325 MG tablet Commonly known as: Percocet Take 1 tablet by mouth every 6 (six) hours as needed.   rosuvastatin 10 MG tablet Commonly known as: CRESTOR Take 1 tablet (10 mg total) by mouth daily. What changed: Another medication with the same name was added. Make sure you understand how and when to take each.   rosuvastatin 20 MG tablet Commonly known as: CRESTOR Take 1 tablet (20 mg total) by mouth daily. Start taking on: March 24, 2022 What changed: You were already taking a medication with the same name, and this prescription was added. Make sure you understand how and when to take each.        Discharge Instructions: [facility] Discharge instructions Lower Extremity Bypass Surgery  Please refer to the following instruction for your post-procedure care. Your surgeon or physician assistant will discuss any changes with you.  Activity  You are encouraged to walk as much as you can. You can slowly return to normal activities during the month after your surgery. Avoid strenuous activity and heavy lifting until your doctor tells you it's OK. Avoid activities such as vacuuming or swinging a golf club. Do not drive until your doctor give the OK and you are no longer taking prescription pain medications. It is also normal to have difficulty with sleep habits, eating and bowel movement after surgery. These will go away with time.  Bathing/Showering  You may shower after you go home. Do not soak in a bathtub, hot tub, or swim until the incision heals completely.  Incision Care  Clean your incision with mild  soap and water. Shower every day. Pat the area dry with a clean towel. You do not need a bandage unless otherwise instructed. Do not apply any ointments or creams to your incision. If you have open wounds you will be instructed how to care for them or a visiting nurse may be arranged for you. If you have staples or sutures along your incision they will be removed at your post-op appointment. You may have skin glue on your incision. Do not peel it off. It will come off on its own in about one week.  Wash the groin wound with soap and water daily and pat dry. (No tub bath-only shower)  Then put a dry gauze or washcloth in the groin to keep  this area dry to help prevent wound infection.  Do this daily and as needed.  Do not use Vaseline or neosporin on your incisions.  Only use soap and water on your incisions and then protect and keep dry.  Diet  Resume your normal diet. There are no special food restrictions following this procedure. A low fat/ low cholesterol diet is recommended for all patients with vascular disease. In order to heal from your surgery, it is CRITICAL to get adequate nutrition. Your body requires vitamins, minerals, and protein. Vegetables are the best source of vitamins and minerals. Vegetables also provide the perfect balance of protein. Processed food has little nutritional value, so try to avoid this.  Medications  Resume taking all your medications unless your doctor or Physician Assistant tells you not to. If your incision is causing pain, you may take over-the-counter pain relievers such as acetaminophen (Tylenol). If you were prescribed a stronger pain medication, please aware these medication can cause nausea and constipation. Prevent nausea by taking the medication with a snack or meal. Avoid constipation by drinking plenty of fluids and eating foods with high amount of fiber, such as fruits, vegetables, and grains. Take Colace 100 mg (an over-the-counter stool softener) twice a  day as needed for constipation.  Do not take Tylenol if you are taking prescription pain medications.  Follow Up  Our office will schedule a follow up appointment 2-3 weeks following discharge.  Please call us immediately for any of the following conditions  Severe or worsening pain in your legs or feet while at rest or while walking Increase pain, redness, warmth, or drainage (pus) from your incision site(s) Fever of 101 degree or higher The swelling in your leg with the bypass suddenly worsens and becomes more painful than when you were in the hospital If you have been instructed to feel your graft pulse then you should do so every day. If you can no longer feel this pulse, call the office immediately. Not all patients are given this instruction.  Leg swelling is common after leg bypass surgery.  The swelling should improve over a few months following surgery. To improve the swelling, you may elevate your legs above the level of your heart while you are sitting or resting. Your surgeon or physician assistant may ask you to apply an ACE wrap or wear compression (TED) stockings to help to reduce swelling.  Reduce your risk of vascular disease  Stop smoking. If you would like help call [facility] at 1-800-QUIT-NOW ((928) 439-7480) or [place] at [phone].  Manage your cholesterol Maintain a desired weight Control your diabetes weight Control your diabetes Keep your blood pressure down  If you have any questions, please call the office at [phone]   Prescriptions given: 1.  Roxicet #20 No Refill 2.  Crestor 20mg  daily #90 three refills  Disposition: home  Patient's condition: is Good  Follow up: 1. Dr. in 3 weeks with ABI and RLE arterial duplex   Myra Gianotti, PA-C [facility] [phone] 03/23/2022  2:17 PM  - For VQI Registry use ---   Post-op:  Wound infection: No  Graft infection: No  Transfusion: No    If yes, n/a units  given New Arrhythmia: No Ipsilateral amputation: No, [ ]  Minor, [ ]  BKA, [ ]  AKA Discharge patency: [x ] Primary, [ ]  Primary assisted, [ ]  Secondary, [ ]  Occluded Patency judged by: [ ]  Dopper only, [ ]  Palpable graft pulse, [x]  Palpable distal pulse, [ ]   ABI inc. > 0.15, [ ]  Duplex Discharge ABI: R not done, L  D/C Ambulatory Status: Ambulatory  Complications: MI: No, [ ]  Troponin only, [ ]  EKG or Clinical CHF: No Resp failure:No, [ ]  Pneumonia, [ ]  Ventilator Chg in renal function: No, [ ]  Inc. Cr > 0.5, [ ]  Temp. Dialysis,  [ ]  Permanent dialysis Stroke: No, [ ]  Minor, [ ]  Major Return to OR: No  Reason for return to OR: [ ]  Bleeding, [ ]  Infection, [ ]  Thrombosis, [ ]  Revision  Discharge medications: Statin use:  yes ASA use:  yes Plavix use:  no Beta blocker use: no CCB use:  No ACEI use:   no ARB use:  no Coumadin use: no

## 2022-03-23 NOTE — CV Procedure (Addendum)
INDICATIONS: L popliteal occlusion  PROCEDURE:   Informed consent was obtained prior to the procedure. The risks, benefits and alternatives for the procedure were discussed and the patient comprehended these risks.  Risks include, but are not limited to, cough, sore throat, vomiting, nausea, somnolence, esophageal and stomach trauma or perforation, bleeding, low blood pressure, aspiration, pneumonia, infection, trauma to the teeth and death.    After a procedural time-out, the oropharynx was anesthetized with 20% benzocaine spray.   During this procedure the patient was administered propofol to achieve and maintain moderate conscious sedation.  The patient's heart rate, blood pressure, and oxygen saturationweare monitored continuously during the procedure. The period of conscious sedation was 15 minutes, of which I was present face-to-face 100% of this time.  The transesophageal probe was inserted in the esophagus and stomach without difficulty and multiple views were obtained.  The patient was kept under observation until the patient left the procedure room.  The patient left the procedure room in stable condition.   Agitated microbubble saline contrast was administered.  COMPLICATIONS:    There were no immediate complications.  FINDINGS:  Normal LV/RV function No significant valve disease No LAA thrombus No LV thrombus No PFO No ASD  RECOMMENDATIONS:     None  Time Spent Directly with the Patient:  15 minutes   Maisie Fus 03/23/2022, 8:36 AM

## 2022-03-23 NOTE — Progress Notes (Signed)
Mobility Specialist: Progress Note   03/23/22 1107  Mobility  Activity Ambulated with assistance in hallway  Level of Assistance Modified independent, requires aide device or extra time  Assistive Device Front wheel walker  Distance Ambulated (ft) 470 ft  Activity Response Tolerated well  $Mobility charge 1 Mobility   Pre-Mobility: 67 HR Post-Mobility: 70 HR, 145/89 (106) BP, 100% SpO2  Pt received in the bed and agreeable to mobility. C/o LLE tightness, otherwise asymptomatic. Pt to the recliner after session with call bell and phone in reach.   [facility] Zareena Willis Mobility Specialist Mobility Specialist [address]: [phone]

## 2022-03-23 NOTE — Anesthesia Procedure Notes (Signed)
Procedure Name: MAC Date/Time: 03/23/2022 8:11 AM  Performed by: Dorann Lodge, CRNAPre-anesthesia Checklist: Patient identified, Emergency Drugs available, Suction available and Patient being monitored Patient Re-evaluated:Patient Re-evaluated prior to induction Oxygen Delivery Method: Nasal cannula Induction Type: IV induction Airway Equipment and Method: Bite block Dental Injury: Teeth and Oropharynx as per pre-operative assessment

## 2022-03-23 NOTE — Discharge Instructions (Signed)
 [facility] [place]  Discharge instructions  Lower Extremity Bypass Surgery  Please refer to the following instruction for your post-procedure care. Your surgeon or physician assistant will discuss any changes with you.  Activity  You are encouraged to walk as much as you can. You can slowly return to normal activities during the month after your surgery. Avoid strenuous activity and heavy lifting until your doctor tells you it's OK. Avoid activities such as vacuuming or swinging a golf club. Do not drive until your doctor give the OK and you are no longer taking prescription pain medications. It is also normal to have difficulty with sleep habits, eating and bowel movement after surgery. These will go away with time.  Bathing/Showering  Shower daily after you go home. Do not soak in a bathtub, hot tub, or swim until the incision heals completely.  Incision Care  Clean your incision with mild soap and water. Shower every day. Pat the area dry with a clean towel. You do not need a bandage unless otherwise instructed. Do not apply any ointments or creams to your incision. If you have open wounds you will be instructed how to care for them or a visiting nurse may be arranged for you. If you have staples or sutures along your incision they will be removed at your post-op appointment. You may have skin glue on your incision. Do not peel it off. It will come off on its own in about one week.  Wash the groin wound with soap and water daily and pat dry. (No tub bath-only shower)  Then put a dry gauze or washcloth in the groin to keep this area dry to help prevent wound infection.  Do this daily and as needed.  Do not use Vaseline or neosporin on your incisions.  Only use soap and water on your incisions and then protect and keep dry.  Diet  Resume your normal diet. There are no special food restrictions following this procedure. A low fat/ low cholesterol diet is  recommended for all patients with vascular disease. In order to heal from your surgery, it is CRITICAL to get adequate nutrition. Your body requires vitamins, minerals, and protein. Vegetables are the best source of vitamins and minerals. Vegetables also provide the perfect balance of protein. Processed food has little nutritional value, so try to avoid this.  Medications  Resume taking all your medications unless your doctor or physician assistant tells you not to. If your incision is causing pain, you may take over-the-counter pain relievers such as acetaminophen (Tylenol). If you were prescribed a stronger pain medication, please aware these medication can cause nausea and constipation. Prevent nausea by taking the medication with a snack or meal. Avoid constipation by drinking plenty of fluids and eating foods with high amount of fiber, such as fruits, vegetables, and grains. Take Colace 100 mg (an over-the-counter stool softener) twice a day as needed for constipation.  Do not take Tylenol if you are taking prescription pain medications.  Follow Up  Our office will schedule a follow up appointment 2-3 weeks following discharge.  Please call us immediately for any of the following conditions  Severe or worsening pain in your legs or feet while at rest or while walking Increase pain, redness, warmth, or drainage (pus) from your incision site(s) Fever of 101 degree or higher The swelling in your leg with the bypass suddenly worsens and becomes more painful than when you were in the hospital If you have   been instructed to feel your graft pulse then you should do so every day. If you can no longer feel this pulse, call the office immediately. Not all patients are given this instruction.  Leg swelling is common after leg bypass surgery.  The swelling should improve over a few months following surgery. To improve the swelling, you may elevate your legs above the level of your heart while you are  sitting or resting. Your surgeon or physician assistant may ask you to apply an ACE wrap or wear compression (TED) stockings to help to reduce swelling.  Reduce your risk of vascular disease  Stop smoking. If you would like help call [facility] at 1-800-QUIT-NOW (1-800-784-8669) or [place] at [phone].  Manage your cholesterol Maintain a desired weight Control your diabetes weight Control your diabetes Keep your blood pressure down  If you have any questions, please call the office at [phone]  

## 2022-03-23 NOTE — Transfer of Care (Signed)
Immediate Anesthesia Transfer of Care Note  Patient: Shawn Hall  Procedure(s) Performed: TRANSESOPHAGEAL ECHOCARDIOGRAM (TEE) BUBBLE STUDY  Patient Location: Endoscopy Unit  Anesthesia Type:MAC  Level of Consciousness: drowsy  Airway & Oxygen Therapy: Patient Spontanous Breathing and Patient connected to nasal cannula oxygen  Post-op Assessment: Report given to RN and Post -op Vital signs reviewed and stable  Post vital signs: Reviewed and stable  Last Vitals:  Vitals Value Taken Time  BP    Temp    Pulse 70 03/23/22 0837  Resp 11 03/23/22 0837  SpO2 96 % 03/23/22 0837  Vitals shown include unvalidated device data.  Last Pain:  Vitals:   03/23/22 0755  TempSrc: Temporal  PainSc: 6          Complications: No notable events documented.

## 2022-03-24 NOTE — Progress Notes (Signed)
    Subjective  - POD #3  No complaints   Physical Exam:  Palpable left DP'+ L LE edema       Assessment/Plan:  POD #3  Hypercoag and embolic w/u negative to date D/C  today on ASA and crestor F/u in the office in a few weeks  Wells Shawn Hall 03/24/2022 8:57 AM --  Vitals:   03/24/22 0352 03/24/22 0819  BP: 120/68 133/86  Pulse: 70 76  Resp: 14 18  Temp: 98.5 F (36.9 C) 97.8 F (36.6 C)  SpO2: 99% 98%    Intake/Output Summary (Last 24 hours) at 03/24/2022 0857 Last data filed at 03/24/2022 0818 Gross per 24 hour  Intake 240 ml  Output --  Net 240 ml     Laboratory CBC    Component Value Date/Time   WBC 9.3 03/22/2022 0059   HGB 11.2 (L) 03/22/2022 0059   HGB 15.0 03/16/2022 0806   HCT 33.0 (L) 03/22/2022 0059   HCT 44.5 03/16/2022 0806   PLT 240 03/22/2022 0059   PLT 300 03/16/2022 0806    BMET    Component Value Date/Time   NA 137 03/22/2022 0059   NA 139 03/16/2022 0806   K 4.1 03/22/2022 0059   CL 108 03/22/2022 0059   CO2 22 03/22/2022 0059   GLUCOSE 127 (H) 03/22/2022 0059   BUN 11 03/22/2022 0059   BUN 16 03/16/2022 0806   CREATININE 0.81 03/22/2022 0059   CALCIUM 8.2 (L) 03/22/2022 0059   GFRNONAA >60 03/22/2022 0059    COAG Lab Results  Component Value Date   INR 1.0 03/19/2022   No results found for: "PTT"  Antibiotics Anti-infectives (From admission, onward)    Start     Dose/Rate Route Frequency Ordered Stop   03/21/22 2000  vancomycin (VANCOCIN) IVPB 1000 mg/200 mL premix        1,000 mg 200 mL/hr over 60 Minutes Intravenous Every 12 hours 03/21/22 1744 03/22/22 1209   03/21/22 0625  vancomycin (VANCOCIN) IVPB 1000 mg/200 mL premix        1,000 mg 200 mL/hr over 60 Minutes Intravenous 60 min pre-op 03/21/22 0625 03/21/22 0833        V. Charlena Cross, M.D., [facility] [place] Office: [phone] Pager:  [phone]

## 2022-03-24 NOTE — TOC CM/SW Note (Addendum)
Spoke with Shawn Hall prior to discharge. He is aware dc medications are in main pharmacy. Shawn Hall does not have Primary Care Provider. Provided [facility] Connect number [phone] to call for assist in finding PCP. Shawn Hall is from home with spouse. No further TOC needs identified.   Raiford Noble, MSN, RN,BSN Inpatient [facility] Case Manager [phone]

## 2022-03-24 NOTE — Progress Notes (Signed)
Patient discharged from 4E room 4.  CCMD notified.  IV(s) removed.  All belongings (including medication from pharmacy) given to wife of patient.    Caprice Beaver, RN

## 2022-03-25 ENCOUNTER — Encounter (HOSPITAL_COMMUNITY): Payer: Self-pay | Admitting: Internal Medicine

## 2022-03-25 LAB — PROTEIN C, TOTAL: Protein C, Total: 74 % (ref 60–150)

## 2022-03-27 ENCOUNTER — Other Ambulatory Visit: Payer: Self-pay | Admitting: Physician Assistant

## 2022-03-27 LAB — FACTOR 5 LEIDEN

## 2022-03-27 LAB — PROTHROMBIN GENE MUTATION

## 2022-03-29 ENCOUNTER — Other Ambulatory Visit: Payer: Self-pay | Admitting: *Deleted

## 2022-03-29 DIAGNOSIS — I70321 Atherosclerosis of unspecified type of bypass graft(s) of the extremities with rest pain, right leg: Secondary | ICD-10-CM

## 2022-03-29 DIAGNOSIS — I70212 Atherosclerosis of native arteries of extremities with intermittent claudication, left leg: Secondary | ICD-10-CM

## 2022-03-30 ENCOUNTER — Other Ambulatory Visit (HOSPITAL_BASED_OUTPATIENT_CLINIC_OR_DEPARTMENT_OTHER): Payer: Self-pay

## 2022-04-13 ENCOUNTER — Ambulatory Visit (HOSPITAL_COMMUNITY)
Admission: RE | Admit: 2022-04-13 | Discharge: 2022-04-13 | Disposition: A | Discharge status: Home / Self Care | Payer: 59 | Source: Ambulatory Visit | Attending: Physician Assistant | Admitting: Physician Assistant

## 2022-04-13 ENCOUNTER — Ambulatory Visit (INDEPENDENT_AMBULATORY_CARE_PROVIDER_SITE_OTHER)
Admission: RE | Admit: 2022-04-13 | Discharge: 2022-04-13 | Disposition: A | Discharge status: Home / Self Care | Payer: 59 | Source: Ambulatory Visit | Attending: Vascular Surgery | Admitting: Vascular Surgery

## 2022-04-13 DIAGNOSIS — I70212 Atherosclerosis of native arteries of extremities with intermittent claudication, left leg: Secondary | ICD-10-CM | POA: Diagnosis present

## 2022-04-13 DIAGNOSIS — I70321 Atherosclerosis of unspecified type of bypass graft(s) of the extremities with rest pain, right leg: Secondary | ICD-10-CM | POA: Insufficient documentation

## 2022-04-16 ENCOUNTER — Encounter: Payer: Self-pay | Admitting: Surgery

## 2022-04-16 ENCOUNTER — Ambulatory Visit (INDEPENDENT_AMBULATORY_CARE_PROVIDER_SITE_OTHER): Payer: 59 | Admitting: Surgery

## 2022-04-16 VITALS — BP 131/89 | HR 78 | Temp 98.7°F | Resp 20 | Ht 76.0 in | Wt 223.0 lb

## 2022-04-16 DIAGNOSIS — I70212 Atherosclerosis of native arteries of extremities with intermittent claudication, left leg: Secondary | ICD-10-CM

## 2022-04-16 DIAGNOSIS — I70202 Unspecified atherosclerosis of native arteries of extremities, left leg: Secondary | ICD-10-CM

## 2022-04-16 NOTE — Progress Notes (Signed)
Patient name: Shawn Hall MRN: [id number] DOB: [birth year] Sex: male  REASON FOR VISIT:     Postop  HISTORY OF PRESENT ILLNESS:   Shawn Hall is a 36 y.o. male with a several year history of left leg pain and numbness with activity.  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  He underwent an extensive workup including a MRI which was negative for popliteal entrapment.  A embolic workup was also negative.  Ultimately, on 03/21/2022, he underwent a distal left superficial femoral artery to left anterior tibial artery bypass with reversed ipsilateral saphenous vein.  I also performed a left tibioperoneal trunk and peroneal artery endarterectomy with a jump graft using the saphenous vein which originated below the knee from the above bypass to the tibioperoneal trunk extending onto the peroneal artery.  He had an excellent caliber vein.  He was discharged home on postoperative day 3.  He had a palpable dorsalis pedis pulse.  He feels that he turned the corner last week.  He still has leg swelling and some discomfort around his knee and anterior shin.  He no longer has the claudication symptoms  CURRENT MEDICATIONS:    Current Outpatient Medications  Medication Sig Dispense Refill   acetaminophen (TYLENOL) 500 MG tablet Take 1,000 mg by mouth daily as needed for moderate pain.     aspirin EC 81 MG tablet Take 1 tablet (81 mg total) by mouth daily at 6 (six) AM. Swallow whole.     fexofenadine (ALLEGRA) 180 MG tablet Take 180 mg by mouth daily as needed for allergies or rhinitis.     rosuvastatin (CRESTOR) 20 MG tablet Take 1 tablet (20 mg total) by mouth daily. 90 tablet 3   No current facility-administered medications for this visit.    REVIEW OF SYSTEMS:   [X]  denotes positive finding, [ ]  denotes negative finding Cardiac  Comments:  Chest pain or chest pressure:    Shortness of breath upon exertion:    Short of breath when lying flat:     Irregular heart rhythm:    Constitutional    Fever or chills:      PHYSICAL EXAM:   Vitals:   04/16/22 0850  BP: 131/89  Pulse: 78  Resp: 20  Temp: 98.7 F (37.1 C)  SpO2: 96%  Weight: 223 lb (101.2 kg)  Height: 6\' 4"  (1.93 m)    GENERAL: The patient is a well-nourished male, in no acute distress. The vital signs are documented above. CARDIOVASCULAR: There is a regular rate and rhythm. PULMONARY: Non-labored respirations Incisions are healing nicely.  Palpable left dorsalis pedis pulse  STUDIES:   +-------+-----------+-----------+------------+------------+  ABI/TBIToday's ABIToday's TBIPrevious ABIPrevious TBI  +-------+-----------+-----------+------------+------------+  Right  1.11       0.75       1.11        1.11          +-------+-----------+-----------+------------+------------+  Left   0.94       0.45       0.81        0.72          +-------+-----------+-----------+------------+------------+   Left: Graft appears patent where visualized. Limited due to edema.   MEDICAL ISSUES:   Status post left femoral-tibial bypass and jump graft to tibioperoneal trunk with vein: He is recovering as expected.  He still has lower extremity edema.  The incisions of healed nicely he has a palpable dorsalis pedis pulse.  I have encouraged him to  increase his activity gradually.  He can begin wearing compression socks to help with the edema.  He will follow-up in 3 months with repeat imaging  Charlena Cross, MD, FACS [facility] [phone] Pager [phone]

## 2022-04-20 ENCOUNTER — Other Ambulatory Visit: Payer: Self-pay

## 2022-04-20 DIAGNOSIS — I70212 Atherosclerosis of native arteries of extremities with intermittent claudication, left leg: Secondary | ICD-10-CM

## 2022-05-11 ENCOUNTER — Other Ambulatory Visit (HOSPITAL_BASED_OUTPATIENT_CLINIC_OR_DEPARTMENT_OTHER): Payer: Self-pay

## 2022-06-18 ENCOUNTER — Ambulatory Visit: Payer: 59 | Attending: Cardiovascular Disease

## 2022-06-18 DIAGNOSIS — E782 Mixed hyperlipidemia: Secondary | ICD-10-CM

## 2022-06-18 DIAGNOSIS — Z79899 Other long term (current) drug therapy: Secondary | ICD-10-CM

## 2022-06-18 LAB — HEPATIC FUNCTION PANEL
ALT: 31 IU/L (ref 0–44)
AST: 28 IU/L (ref 0–40)
Albumin: 4.9 g/dL (ref 4.1–5.1)
Alkaline Phosphatase: 84 IU/L (ref 44–121)
Bilirubin Total: 0.4 mg/dL (ref 0.0–1.2)
Bilirubin, Direct: 0.15 mg/dL (ref 0.00–0.40)
Total Protein: 7.4 g/dL (ref 6.0–8.5)

## 2022-06-18 LAB — LIPID PANEL
Chol/HDL Ratio: 2.4 ratio (ref 0.0–5.0)
Cholesterol, Total: 144 mg/dL (ref 100–199)
HDL: 61 mg/dL (ref >39)
LDL Chol Calc (NIH): 71 mg/dL (ref 0–99)
Triglycerides: 58 mg/dL (ref 0–149)
VLDL Cholesterol Cal: 12 mg/dL (ref 5–40)

## 2022-06-20 ENCOUNTER — Other Ambulatory Visit: Payer: 59

## 2022-06-27 ENCOUNTER — Telehealth: Payer: Self-pay | Admitting: Cardiovascular Disease

## 2022-06-27 NOTE — Telephone Encounter (Signed)
Patient is returning LPN's call for lab results. Please advise.

## 2022-07-09 NOTE — Telephone Encounter (Signed)
See lab results ./cy 

## 2022-07-16 ENCOUNTER — Ambulatory Visit (INDEPENDENT_AMBULATORY_CARE_PROVIDER_SITE_OTHER)
Admission: RE | Admit: 2022-07-16 | Discharge: 2022-07-16 | Disposition: A | Discharge status: Home / Self Care | Payer: 59 | Source: Ambulatory Visit | Attending: Surgery | Admitting: Surgery

## 2022-07-16 ENCOUNTER — Encounter: Payer: Self-pay | Admitting: Surgery

## 2022-07-16 ENCOUNTER — Ambulatory Visit (INDEPENDENT_AMBULATORY_CARE_PROVIDER_SITE_OTHER): Payer: 59 | Admitting: Surgery

## 2022-07-16 ENCOUNTER — Ambulatory Visit (HOSPITAL_COMMUNITY)
Admission: RE | Admit: 2022-07-16 | Discharge: 2022-07-16 | Disposition: A | Discharge status: Home / Self Care | Payer: 59 | Source: Ambulatory Visit | Attending: Surgery | Admitting: Surgery

## 2022-07-16 VITALS — BP 134/84 | HR 68 | Temp 98.6°F | Resp 20 | Ht 76.0 in | Wt 220.0 lb

## 2022-07-16 DIAGNOSIS — I70212 Atherosclerosis of native arteries of extremities with intermittent claudication, left leg: Secondary | ICD-10-CM

## 2022-07-16 NOTE — Progress Notes (Signed)
[facility]ascular and Vein Specialist of [facility]  Patient name: Shawn Hall MRN: [id number] DOB: 01-19-1986 Sex: male   REASON FOR VISIT:    Follow up  HISOTRY OF PRESENT ILLNESS:    Shawn Hall is a 36 y.o. male with a several year history of left leg pain and numbness with activity.  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  He underwent an extensive workup including a MRI which was negative for popliteal entrapment.  A embolic workup was also negative.  Ultimately, on 03/21/2022, he underwent a distal left superficial femoral artery to left anterior tibial artery bypass with reversed ipsilateral saphenous vein.  I also performed a left tibioperoneal trunk and peroneal artery endarterectomy with a jump graft using the saphenous vein which originated below the knee from the above bypass to the tibioperoneal trunk extending onto the peroneal artery.  He had an excellent caliber vein.  He was discharged home on postoperative day 3.  He had a palpable dorsalis pedis pulse.   His leg issues have seen adequately improved.  He still gets some tingling and swelling when he has been up on his leg.  He is back to doing some normal activities.   PAST MEDICAL HISTORY:   Past Medical History:  Diagnosis Date   PAD (peripheral artery disease) (HCC)      FAMILY HISTORY:   Family History  Problem Relation Age of Onset   Heart disease Father     SOCIAL HISTORY:   Social History   Tobacco Use   Smoking status: Never   Smokeless tobacco: Never  Substance Use Topics   Alcohol use: Not on file    Comment: occasional     ALLERGIES:   Allergies  Allergen Reactions   Penicillins Hives    Childhood reaction.     CURRENT MEDICATIONS:   Current Outpatient Medications  Medication Sig Dispense Refill   acetaminophen (TYLENOL) 500 MG tablet Take 1,000 mg by mouth daily as needed for moderate pain.     aspirin EC 81 MG tablet Take 1  tablet (81 mg total) by mouth daily at 6 (six) AM. Swallow whole.     fexofenadine (ALLEGRA) 180 MG tablet Take 180 mg by mouth daily as needed for allergies or rhinitis.     rosuvastatin (CRESTOR) 10 MG tablet Take 10 mg by mouth at bedtime.     No current facility-administered medications for this visit.    REVIEW OF SYSTEMS:   [X]  denotes positive finding, [ ]  denotes negative finding Cardiac  Comments:  Chest pain or chest pressure:    Shortness of breath upon exertion:    Short of breath when lying flat:    Irregular heart rhythm:        Vascular    Pain in calf, thigh, or hip brought on by ambulation:    Pain in feet at night that wakes you up from your sleep:     Blood clot in your veins:    Leg swelling:         Pulmonary    Oxygen at home:    Productive cough:     Wheezing:         Neurologic    Sudden weakness in arms or legs:     Sudden numbness in arms or legs:     Sudden onset of difficulty speaking or slurred speech:    Temporary loss of vision in one eye:     Problems with dizziness:  Gastrointestinal    Blood in stool:     Vomited blood:         Genitourinary    Burning when urinating:     Blood in urine:        Psychiatric    Major depression:         Hematologic    Bleeding problems:    Problems with blood clotting too easily:        Skin    Rashes or ulcers:        Constitutional    Fever or chills:      PHYSICAL EXAM:   Vitals:   07/16/22 1152  BP: 134/84  Pulse: 68  Resp: 20  Temp: 98.6 F (37 C)  SpO2: 98%  Weight: 220 lb (99.8 kg)  Height: 6\' 4"  (1.93 m)    GENERAL: The patient is a well-nourished male, in no acute distress. The vital signs are documented above. CARDIAC: There is a regular rate and rhythm.  VASCULAR: Palpable dorsalis pedis pulse, left PULMONARY: Non-labored respirations MUSCULOSKELETAL: There are no major deformities or cyanosis. NEUROLOGIC: No focal weakness or paresthesias are detected. SKIN:  There are no ulcers or rashes noted. PSYCHIATRIC: The patient has a normal affect.  STUDIES:   I have reviewed the following: +-------+-----------+-----------+------------+------------+  ABI/TBIToday's ABIToday's TBIPrevious ABIPrevious TBI  +-------+-----------+-----------+------------+------------+  Right 1.12       0.84       1.11        0.75          +-------+-----------+-----------+------------+------------+  Left  1.03       1.19       0.94        0.45          +-------+-----------+-----------+------------+------------+    MEDICAL ISSUES:   Status Left: Patent distal SFA to ATA bypass graft without evidence of stenosis.  Patent anterior tibial to tibioperoneal bypass. post left femoral to anterior tibial bypass with jump graft to the TP trunk/peroneal artery.  He continues to do very well.  The edema in his leg is improving.  There is some question about his anterior tibial anastomosis, that could be related to angulation.  I will pay attention to this at his next visit.  He will follow-up in 3 months with repeat imaging.    Leia Alf, MD, FACS [facility] of [facility] [phone] Pager [phone]

## 2022-07-19 ENCOUNTER — Other Ambulatory Visit: Payer: Self-pay

## 2022-07-19 DIAGNOSIS — I70212 Atherosclerosis of native arteries of extremities with intermittent claudication, left leg: Secondary | ICD-10-CM

## 2022-07-19 NOTE — Progress Notes (Signed)
Error

## 2022-08-22 ENCOUNTER — Other Ambulatory Visit (HOSPITAL_COMMUNITY): Payer: Self-pay

## 2022-10-22 ENCOUNTER — Ambulatory Visit (HOSPITAL_COMMUNITY): Payer: 59

## 2022-10-22 ENCOUNTER — Ambulatory Visit: Payer: 59 | Admitting: Surgery

## 2022-10-22 ENCOUNTER — Ambulatory Visit (HOSPITAL_COMMUNITY): Payer: 59 | Attending: Surgery

## 2022-10-22 NOTE — Progress Notes (Deleted)
Vascular and Vein Specialist of [facility]  Patient name: Shawn Hall MRN: [id number] DOB: Oct 27, 1985 Sex: male   REASON FOR VISIT:    Follow up  HISOTRY OF PRESENT ILLNESS:   Shawn Hall is a 37 y.o. male with a several year history of left leg pain and numbness with activity.  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  He underwent an extensive workup including a MRI which was negative for popliteal entrapment.  A embolic workup was also negative.  Ultimately, on 03/21/2022, he underwent a distal left superficial femoral artery to left anterior tibial artery bypass with reversed ipsilateral saphenous vein.  I also performed a left tibioperoneal trunk and peroneal artery endarterectomy with a jump graft using the saphenous vein which originated below the knee from the above bypass to the tibioperoneal trunk extending onto the peroneal artery.  He had an excellent caliber vein.  He was discharged home on postoperative day 3.  He had a palpable dorsalis pedis pulse.     PAST MEDICAL HISTORY:   Past Medical History:  Diagnosis Date   PAD (peripheral artery disease) ([place])      FAMILY HISTORY:   Family History  Problem Relation Age of Onset   Heart disease Father     SOCIAL HISTORY:   Social History   Tobacco Use   Smoking status: Never   Smokeless tobacco: Never  Substance Use Topics   Alcohol use: Not on file    Comment: occasional     ALLERGIES:   Allergies  Allergen Reactions   Penicillins Hives    Childhood reaction.     CURRENT MEDICATIONS:   Current Outpatient Medications  Medication Sig Dispense Refill   acetaminophen (TYLENOL) 500 MG tablet Take 1,000 mg by mouth daily as needed for moderate pain.     aspirin EC 81 MG tablet Take 1 tablet (81 mg total) by mouth daily at 6 (six) AM. Swallow whole.     fexofenadine (ALLEGRA) 180 MG tablet Take 180 mg by mouth daily as needed for allergies or rhinitis.      rosuvastatin (CRESTOR) 10 MG tablet Take 10 mg by mouth at bedtime.     No current facility-administered medications for this visit.    REVIEW OF SYSTEMS:   [X]$  denotes positive finding, [ ]$  denotes negative finding Cardiac  Comments:  Chest pain or chest pressure: ***   Shortness of breath upon exertion:    Short of breath when lying flat:    Irregular heart rhythm:        Vascular    Pain in calf, thigh, or hip brought on by ambulation:    Pain in feet at night that wakes you up from your sleep:     Blood clot in your veins:    Leg swelling:         Pulmonary    Oxygen at home:    Productive cough:     Wheezing:         Neurologic    Sudden weakness in arms or legs:     Sudden numbness in arms or legs:     Sudden onset of difficulty speaking or slurred speech:    Temporary loss of vision in one eye:     Problems with dizziness:         Gastrointestinal    Blood in stool:     Vomited blood:         Genitourinary  Burning when urinating:     Blood in urine:        Psychiatric    Major depression:         Hematologic    Bleeding problems:    Problems with blood clotting too easily:        Skin    Rashes or ulcers:        Constitutional    Fever or chills:      PHYSICAL EXAM:   There were no vitals filed for this visit.  GENERAL: The patient is a well-nourished male, in no acute distress. The vital signs are documented above. CARDIAC: There is a regular rate and rhythm.  VASCULAR: *** PULMONARY: Non-labored respirations ABDOMEN: Soft and non-tender with normal pitched bowel sounds.  MUSCULOSKELETAL: There are no major deformities or cyanosis. NEUROLOGIC: No focal weakness or paresthesias are detected. SKIN: There are no ulcers or rashes noted. PSYCHIATRIC: The patient has a normal affect.  STUDIES:   ***  MEDICAL ISSUES:   ***    Leia Alf, MD, FACS [facility] of [facility] [phone] Pager [phone]

## 2023-04-16 ENCOUNTER — Ambulatory Visit (HOSPITAL_COMMUNITY)
Admission: RE | Admit: 2023-04-16 | Discharge: 2023-04-16 | Disposition: A | Discharge status: Home / Self Care | Payer: 59 | Source: Ambulatory Visit | Attending: Nurse Practitioner | Admitting: Nurse Practitioner

## 2023-04-16 ENCOUNTER — Encounter (HOSPITAL_COMMUNITY): Payer: Self-pay

## 2023-04-16 VITALS — BP 136/80 | HR 54 | Temp 98.9°F | Resp 18

## 2023-04-16 DIAGNOSIS — S80861A Insect bite (nonvenomous), right lower leg, initial encounter: Secondary | ICD-10-CM | POA: Diagnosis not present

## 2023-04-16 DIAGNOSIS — L089 Local infection of the skin and subcutaneous tissue, unspecified: Secondary | ICD-10-CM

## 2023-04-16 DIAGNOSIS — W57XXXA Bitten or stung by nonvenomous insect and other nonvenomous arthropods, initial encounter: Secondary | ICD-10-CM | POA: Diagnosis not present

## 2023-04-16 MED ORDER — DOXYCYCLINE HYCLATE 100 MG PO CAPS: 100.0000 mg | ORAL_CAPSULE | Freq: Two times a day (BID) | ORAL | 0 refills | Status: AC

## 2023-04-16 NOTE — ED Triage Notes (Signed)
Pt c/o a red and painful area to rt ankle since Sunday morning. Denies known bug bite.

## 2023-04-16 NOTE — ED Provider Notes (Signed)
[facility]    CSN: [id number] Arrival date & time: 04/16/23  4098      History   Chief Complaint Chief Complaint  Patient presents with   Rash    HPI Shawn Hall is a 37 y.o. male.   Patient presents today for red and painful area to his right lower extremity.  Reports he noticed the area on Sunday and does not recall an insect biting him.  The area got more swollen and painful yesterday, he took some leftover doxycyline and he thinks the pain and swelling improved a little bit.  No fever or nausea/vomiting.  Reports he has been very fatigued the past few days.  No history of similar.    Past Medical History:  Diagnosis Date   PAD (peripheral artery disease) [facility])     Patient Active Problem List   Diagnosis Date Noted   PAD (peripheral artery disease) (HCC) 03/15/2022   Gastrocnemius strain, left 12/01/2014   Arch pain of left foot 12/01/2014   Left knee injury 04/22/2014    Past Surgical History:  Procedure Laterality Date   ABDOMINAL AORTOGRAM W/LOWER EXTREMITY Bilateral 03/06/2022   Procedure: ABDOMINAL AORTOGRAM W/LOWER EXTREMITY;  Surgeon: Nada Libman, MD;  Location: [facility];  Service: Cardiovascular;  Laterality: Bilateral;   BUBBLE STUDY  03/23/2022   Procedure: BUBBLE STUDY;  Surgeon: Maisie Fus, MD;  Location: [facility] ENDOSCOPY;  Service: Cardiovascular;;   FEMORAL-TIBIAL BYPASS GRAFT Left 03/21/2022   Procedure: LEFT FEMORAL-TIBIAL ARTERY BYPASS GRAFT;  Surgeon: Nada Libman, MD;  Location: [facility] OR;  Service: Vascular;  Laterality: Left;   TEE WITHOUT CARDIOVERSION N/A 03/23/2022   Procedure: TRANSESOPHAGEAL ECHOCARDIOGRAM (TEE);  Surgeon: Maisie Fus, MD;  Location: [facility] ENDOSCOPY;  Service: Cardiovascular;  Laterality: N/A;  PT. WILL BE ADMITTED ON 7/12   WISDOM TOOTH EXTRACTION         Home Medications    Prior to Admission medications   Medication Sig Start Date End Date Taking? Authorizing Provider  doxycycline  (VIBRAMYCIN) 100 MG capsule Take 1 capsule (100 mg total) by mouth 2 (two) times daily for 6 days. 04/16/23 04/22/23 Yes Valentino Nose, NP  acetaminophen (TYLENOL) 500 MG tablet Take 1,000 mg by mouth daily as needed for moderate pain.    [provider]  aspirin EC 81 MG tablet Take 1 tablet (81 mg total) by mouth daily at 6 (six) AM. Swallow whole. 03/24/22   Rhyne, Ames Coupe, PA-C  fexofenadine (ALLEGRA) 180 MG tablet Take 180 mg by mouth daily as needed for allergies or rhinitis.    [provider]  rosuvastatin (CRESTOR) 10 MG tablet Take 10 mg by mouth at bedtime. 06/25/22   [provider]    Family History Family History  Problem Relation Age of Onset   Heart disease Father     Social History Social History   Tobacco Use   Smoking status: Never   Smokeless tobacco: Never  Vaping Use   Vaping status: Never Used  Substance Use Topics   Alcohol use: Yes    Comment: occasional   Drug use: Never     Allergies   Penicillins   Review of Systems Review of Systems   Physical Exam Triage Vital Signs ED Triage Vitals [04/16/23 0948]  Encounter Vitals Group     BP 136/80     Systolic BP Percentile      Diastolic BP Percentile      Pulse Rate (!) 54  Resp 18     Temp 98.9 F (37.2 C)     Temp Source Oral     SpO2 98 %     Weight      Height      Head Circumference      Peak Flow      Pain Score 4     Pain Loc      Pain Education      Exclude from Growth Chart    No data found.  Updated Vital Signs BP 136/80 (BP Location: Left Arm)   Pulse (!) 54   Temp 98.9 F (37.2 C) (Oral)   Resp 18   SpO2 98%   Visual Acuity Right Eye Distance:   Left Eye Distance:   Bilateral Distance:    Right Eye Near:   Left Eye Near:    Bilateral Near:     Physical Exam Vitals and nursing note reviewed.  Constitutional:      General: He is not in acute distress.    Appearance: Normal appearance. He is not toxic-appearing.  HENT:      Head: Normocephalic and atraumatic.     Mouth/Throat:     Mouth: Mucous membranes are moist.     Pharynx: Oropharynx is clear.  Pulmonary:     Effort: Pulmonary effort is normal. No respiratory distress.  Musculoskeletal:       Legs:  Skin:    General: Skin is warm and dry.     Capillary Refill: Capillary refill takes less than 2 seconds.     Coloration: Skin is not jaundiced or pale.     Findings: Erythema present. No lesion or rash.     Comments: Irregularly shaped erythematous area to right lower extremity in approximately area marked; area appears indurated with a tiny pustule in the center.  No fluctuance, warmth, or active drainage.  Neurological:     Mental Status: He is alert and oriented to person, place, and time.  Psychiatric:        Behavior: Behavior is cooperative.      UC Treatments / Results  Labs (all labs ordered are listed, but only abnormal results are displayed) Labs Reviewed - No data to display  EKG   Radiology No results found.  Procedures Procedures (including critical care time)  Medications Ordered in UC Medications - No data to display  Initial Impression / Assessment and Plan / UC Course  I have reviewed the triage vital signs and the nursing notes.  Pertinent labs & imaging results that were available during my care of the patient were reviewed by me and considered in my medical decision making (see chart for details).   Patient is well-appearing, normotensive, afebrile, not tachycardic, not tachypneic, oxygenating well on room air.    1. Insect bite of right lower leg, initial encounter 2. Pustule Findings are consistent with insect bite I obtained the patient's consent to deroof the pustule Area was cleaned with chlorhexidine, I used an 18-gauge needle to deroofed the pustule with bloody and purulent drainage noted Patient tolerated well Wound care discussed-recommended applying gauze and tape until area stops draining, then can  leave open to air Continue doxycycline 100 mg twice daily, patient reports he took 2 doses of this already so we will give for 6 more days Strict ER and return precautions discussed with patient  The patient was given the opportunity to ask questions.  All questions answered to their satisfaction.  The patient is in agreement to this  plan.    Final Clinical Impressions(s) / UC Diagnoses   Final diagnoses:  Insect bite of right lower leg, initial encounter  Pustule     Discharge Instructions      I opened up the pustule on your right lower leg to help it drain.  Please keep the area covered while it is draining.  You can clean twice daily with mild soap and water and apply a new piece of gauze.  Once it stops draining, you can leave it open to air.  Continue doxycycline twice daily to complete a total of 7 days of the doxycycline.  Seek care if area worsens or symptoms persist despite treatment.   ED Prescriptions     Medication Sig Dispense Auth. Provider   doxycycline (VIBRAMYCIN) 100 MG capsule Take 1 capsule (100 mg total) by mouth 2 (two) times daily for 6 days. 12 capsule Valentino Nose, NP      PDMP not reviewed this encounter.   Valentino Nose, NP 04/16/23 1055

## 2023-04-16 NOTE — Discharge Instructions (Signed)
I opened up the pustule on your right lower leg to help it drain.  Please keep the area covered while it is draining.  You can clean twice daily with mild soap and water and apply a new piece of gauze.  Once it stops draining, you can leave it open to air.  Continue doxycycline twice daily to complete a total of 7 days of the doxycycline.  Seek care if area worsens or symptoms persist despite treatment.

## 2023-06-05 ENCOUNTER — Other Ambulatory Visit: Payer: Self-pay | Admitting: Cardiovascular Disease

## 2023-06-05 ENCOUNTER — Other Ambulatory Visit: Payer: Self-pay

## 2023-06-05 MED ORDER — ROSUVASTATIN CALCIUM 10 MG PO TABS: 10.0000 mg | ORAL_TABLET | Freq: Every day | ORAL | 0 refills | Status: DC

## 2023-07-03 ENCOUNTER — Other Ambulatory Visit: Payer: Self-pay | Admitting: Cardiovascular Disease

## 2023-07-15 ENCOUNTER — Other Ambulatory Visit: Payer: Self-pay | Admitting: Cardiovascular Disease

## 2023-07-30 ENCOUNTER — Other Ambulatory Visit: Payer: Self-pay | Admitting: Cardiovascular Disease

## 2023-07-30 NOTE — Telephone Encounter (Signed)
Pt requesting refill. Pt was to f/u as needed. Has had 3 attempts and still has not scheduled a f/u. Please advise on rf. Thank you

## 2023-08-12 ENCOUNTER — Telehealth: Payer: Self-pay | Admitting: Cardiovascular Disease

## 2023-08-12 ENCOUNTER — Other Ambulatory Visit: Payer: Self-pay | Admitting: Cardiovascular Disease

## 2023-08-12 MED ORDER — ROSUVASTATIN CALCIUM 10 MG PO TABS: 10.0000 mg | ORAL_TABLET | Freq: Every day | ORAL | 0 refills | Status: DC

## 2023-08-12 NOTE — Telephone Encounter (Signed)
Rx sent to [facility]

## 2023-08-12 NOTE — Telephone Encounter (Signed)
*  STAT* If patient is at the pharmacy, call can be transferred to refill team.   1. Which medications need to be refilled? (please list name of each medication and dose if known) rosuvastatin (CRESTOR) 10 MG tablet    2. Would you like to learn more about the convenience, safety, & potential cost savings by using the [facility]?     3. Are you open to using the [facility] (Type [facility].  ).   4. Which pharmacy/location (including street and city if local pharmacy) is medication to be sent to? [id number] - [place], [place] - [address] AT [facility] [address]    5. Do they need a 30 day or 90 day supply? 90

## 2023-08-13 ENCOUNTER — Other Ambulatory Visit: Payer: Self-pay | Admitting: Cardiovascular Disease

## 2023-08-29 ENCOUNTER — Ambulatory Visit: Payer: 59 | Admitting: Cardiovascular Disease

## 2023-09-16 ENCOUNTER — Other Ambulatory Visit: Payer: Self-pay | Admitting: Cardiovascular Disease

## 2023-09-17 NOTE — Telephone Encounter (Signed)
 Dr. Earmon Phoenix pt. This Pt is passed 3 attempts. Would Dr. Excell Seltzer like to refill? Please advise.

## 2024-03-06 ENCOUNTER — Other Ambulatory Visit: Payer: Self-pay | Admitting: Surgery

## 2024-03-06 DIAGNOSIS — I70202 Unspecified atherosclerosis of native arteries of extremities, left leg: Secondary | ICD-10-CM

## 2024-03-06 DIAGNOSIS — I70212 Atherosclerosis of native arteries of extremities with intermittent claudication, left leg: Secondary | ICD-10-CM

## 2024-03-06 DIAGNOSIS — I70321 Atherosclerosis of unspecified type of bypass graft(s) of the extremities with rest pain, right leg: Secondary | ICD-10-CM

## 2024-03-23 ENCOUNTER — Ambulatory Visit: Attending: Surgery | Admitting: Surgery

## 2024-03-23 ENCOUNTER — Encounter: Payer: Self-pay | Admitting: Surgery

## 2024-03-23 ENCOUNTER — Ambulatory Visit (HOSPITAL_BASED_OUTPATIENT_CLINIC_OR_DEPARTMENT_OTHER)
Admission: RE | Admit: 2024-03-23 | Discharge: 2024-03-23 | Disposition: A | Discharge status: Home / Self Care | Source: Ambulatory Visit | Attending: Surgery | Admitting: Surgery

## 2024-03-23 ENCOUNTER — Ambulatory Visit (HOSPITAL_COMMUNITY)
Admission: RE | Admit: 2024-03-23 | Discharge: 2024-03-23 | Disposition: A | Discharge status: Home / Self Care | Source: Ambulatory Visit | Attending: Surgery | Admitting: Surgery

## 2024-03-23 VITALS — BP 128/83 | HR 57 | Temp 98.1°F | Ht 76.0 in | Wt 204.0 lb

## 2024-03-23 DIAGNOSIS — I70321 Atherosclerosis of unspecified type of bypass graft(s) of the extremities with rest pain, right leg: Secondary | ICD-10-CM | POA: Diagnosis not present

## 2024-03-23 DIAGNOSIS — I70202 Unspecified atherosclerosis of native arteries of extremities, left leg: Secondary | ICD-10-CM

## 2024-03-23 DIAGNOSIS — I70212 Atherosclerosis of native arteries of extremities with intermittent claudication, left leg: Secondary | ICD-10-CM | POA: Insufficient documentation

## 2024-03-23 LAB — VAS US ABI WITH/WO TBI
Left ABI: 1.18
Right ABI: 1.27

## 2024-03-23 NOTE — Progress Notes (Signed)
 [facility]  Patient name: Shawn Hall MRN: [id number] DOB: 1986-06-20 Sex: male   REASON FOR VISIT:    Follow up  HISOTRY OF PRESENT ILLNESS:   Shawn Hall is a 38 y.o. male with a several year history of left leg pain and numbness with activity.  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  He underwent an extensive workup including a MRI which was negative for popliteal entrapment.  A embolic workup was also negative.  Ultimately, on 03/21/2022, he underwent a distal left superficial femoral artery to left anterior tibial artery bypass with reversed ipsilateral saphenous vein.  I also performed a left tibioperoneal trunk and peroneal artery endarterectomy with a jump graft using the saphenous vein which originated below the knee from the above bypass to the tibioperoneal trunk extending onto the peroneal artery.  He had an excellent caliber vein.  He was discharged home on postoperative day 3.  He had a palpable dorsalis pedis pulse.    He is doing well today without complaints.   PAST MEDICAL HISTORY:   Past Medical History:  Diagnosis Date   PAD (peripheral artery disease) (HCC)      FAMILY HISTORY:   Family History  Problem Relation Age of Onset   Heart disease Father     SOCIAL HISTORY:   Social History   Tobacco Use   Smoking status: Never   Smokeless tobacco: Never  Substance Use Topics   Alcohol use: Yes    Comment: occasional     ALLERGIES:   Allergies  Allergen Reactions   Penicillins Hives    Childhood reaction.     CURRENT MEDICATIONS:   Current Outpatient Medications  Medication Sig Dispense Refill   acetaminophen  (TYLENOL ) 500 MG tablet Take 1,000 mg by mouth daily as needed for moderate pain.     aspirin  EC 81 MG tablet Take 1 tablet (81 mg total) by mouth daily at 6 (six) AM. Swallow whole.     fexofenadine (ALLEGRA) 180 MG tablet Take 180 mg by mouth daily  as needed for allergies or rhinitis.     rosuvastatin  (CRESTOR ) 10 MG tablet TAKE 1 TABLET(10 MG) BY MOUTH DAILY 30 tablet 0   No current facility-administered medications for this visit.    REVIEW OF SYSTEMS:   [X]  denotes positive finding, [ ]  denotes negative finding Cardiac  Comments:  Chest pain or chest pressure:    Shortness of breath upon exertion:    Short of breath when lying flat:    Irregular heart rhythm:        Vascular    Pain in calf, thigh, or hip brought on by ambulation:    Pain in feet at night that wakes you up from your sleep:     Blood clot in your veins:    Leg swelling:         Pulmonary    Oxygen at home:    Productive cough:     Wheezing:         Neurologic    Sudden weakness in arms or legs:     Sudden numbness in arms or legs:     Sudden onset of difficulty speaking or slurred speech:    Temporary loss of vision in one eye:     Problems with dizziness:         Gastrointestinal    Blood in stool:     Vomited blood:  Genitourinary    Burning when urinating:     Blood in urine:        Psychiatric    Major depression:         Hematologic    Bleeding problems:    Problems with blood clotting too easily:        Skin    Rashes or ulcers:        Constitutional    Fever or chills:      PHYSICAL EXAM:   Vitals:   03/23/24 1347  BP: 128/83  Pulse: (!) 57  Temp: 98.1 F (36.7 C)  SpO2: 98%  Weight: 204 lb (92.5 kg)  Height: 6' 4 (1.93 m)    GENERAL: The patient is a well-nourished male, in no acute distress. The vital signs are documented above. CARDIAC: There is a regular rate and rhythm.  VASCULAR: Palpable DP on the left PULMONARY: Non-labored respirations ABDOMEN: Soft and non-tender with normal pitched bowel sounds.  MUSCULOSKELETAL: There are no major deformities or cyanosis. NEUROLOGIC: No focal weakness or paresthesias are detected. SKIN: There are no ulcers or rashes noted. PSYCHIATRIC: The patient has a  normal affect.  STUDIES:   I have reviewed the following:  ABI/TBIToday's ABIToday's TBIPrevious ABIPrevious TBI  +-------+-----------+-----------+------------+------------+  Right 1.27       0.93       1.12        0.84          +-------+-----------+-----------+------------+------------+  Left  1.18       0.97       1.03        1.19          +-------+-----------+-----------+------------+------------+   Left: SFA-ATA bypass graft:   Patent left SFA to ATA bypass graft.   Low velocities in the distal SFA to ATA bypass graft could suggest  threatened graft.   Elevated velocities in the distal anastomosis suggest 50-70% stenosis.    Jump graft:  Patent jump graft from left SFA to ATA bypass graft to tibioperoneal  trunk.   MEDICAL ISSUES:    Status post distal left superficial femoral to left anterior tibial artery with saphenous vein with tibioperoneal trunk and peroneal artery endarterectomy and jump graft with saphenous vein from the tibial bypass to the tibioperoneal trunk extending down onto the peroneal artery.  The patient has normal ABIs and a palpable dorsalis pedis pulse.  He is developing some stenosis at the distal anastomosis.  Peak velocities are in the low 200s.  I will have him follow-up in 1 year with repeat studies.  He knows to contact me should he develop any change in symptoms.   Malvina Serene CLORE, MD, FACS [facility] [phone] Pager [phone]
